# Patient Record
Sex: Female | Born: 1945 | Race: White | Hispanic: No | Marital: Married | Smoking: Former smoker
Health system: Southern US, Community
[De-identification: ages and names within clinical notes are randomized; demographics above are authoritative.]

## PROBLEM LIST (undated history)

## (undated) DIAGNOSIS — I2510 Atherosclerotic heart disease of native coronary artery without angina pectoris: Secondary | ICD-10-CM

## (undated) DIAGNOSIS — M069 Rheumatoid arthritis, unspecified: Secondary | ICD-10-CM

## (undated) DIAGNOSIS — E119 Type 2 diabetes mellitus without complications: Secondary | ICD-10-CM

## (undated) DIAGNOSIS — H269 Unspecified cataract: Secondary | ICD-10-CM

## (undated) DIAGNOSIS — I272 Pulmonary hypertension, unspecified: Secondary | ICD-10-CM

## (undated) DIAGNOSIS — I509 Heart failure, unspecified: Secondary | ICD-10-CM

## (undated) DIAGNOSIS — C801 Malignant (primary) neoplasm, unspecified: Secondary | ICD-10-CM

## (undated) DIAGNOSIS — M1990 Unspecified osteoarthritis, unspecified site: Secondary | ICD-10-CM

## (undated) DIAGNOSIS — E785 Hyperlipidemia, unspecified: Secondary | ICD-10-CM

## (undated) HISTORY — DX: Unspecified osteoarthritis, unspecified site: M19.90

## (undated) HISTORY — DX: Malignant (primary) neoplasm, unspecified: C80.1

## (undated) HISTORY — PX: APPENDECTOMY: SHX54

## (undated) HISTORY — PX: ABDOMINAL HYSTERECTOMY: SHX81

## (undated) HISTORY — DX: Unspecified cataract: H26.9

## (undated) HISTORY — PX: ADRENALECTOMY: SHX876

---

## 1998-04-24 ENCOUNTER — Encounter: Payer: Self-pay | Admitting: Emergency Medicine

## 1998-04-24 ENCOUNTER — Emergency Department (HOSPITAL_COMMUNITY): Admission: EM | Admit: 1998-04-24 | Discharge: 1998-04-24 | Payer: Self-pay | Admitting: Emergency Medicine

## 1999-01-11 ENCOUNTER — Encounter: Payer: Self-pay | Admitting: Family Medicine

## 1999-01-11 ENCOUNTER — Ambulatory Visit (HOSPITAL_COMMUNITY): Admission: RE | Admit: 1999-01-11 | Discharge: 1999-01-11 | Payer: Self-pay | Admitting: Family Medicine

## 1999-03-14 ENCOUNTER — Ambulatory Visit (HOSPITAL_COMMUNITY): Admission: RE | Admit: 1999-03-14 | Discharge: 1999-03-14 | Payer: Self-pay | Admitting: Family Medicine

## 1999-03-14 ENCOUNTER — Encounter: Payer: Self-pay | Admitting: Family Medicine

## 1999-09-13 ENCOUNTER — Encounter: Payer: Self-pay | Admitting: Family Medicine

## 1999-09-13 ENCOUNTER — Ambulatory Visit (HOSPITAL_COMMUNITY): Admission: RE | Admit: 1999-09-13 | Discharge: 1999-09-13 | Payer: Self-pay | Admitting: Family Medicine

## 2001-02-22 ENCOUNTER — Ambulatory Visit (HOSPITAL_BASED_OUTPATIENT_CLINIC_OR_DEPARTMENT_OTHER): Admission: RE | Admit: 2001-02-22 | Discharge: 2001-02-22 | Payer: Self-pay | Admitting: Orthopedic Surgery

## 2001-04-17 ENCOUNTER — Encounter: Payer: Self-pay | Admitting: Emergency Medicine

## 2001-04-17 ENCOUNTER — Emergency Department (HOSPITAL_COMMUNITY): Admission: EM | Admit: 2001-04-17 | Discharge: 2001-04-17 | Payer: Self-pay | Admitting: *Deleted

## 2002-01-24 ENCOUNTER — Encounter: Admission: RE | Admit: 2002-01-24 | Discharge: 2002-01-24 | Payer: Self-pay | Admitting: Family Medicine

## 2002-01-24 ENCOUNTER — Encounter: Payer: Self-pay | Admitting: Family Medicine

## 2003-02-25 ENCOUNTER — Ambulatory Visit (HOSPITAL_COMMUNITY): Admission: RE | Admit: 2003-02-25 | Discharge: 2003-02-25 | Payer: Self-pay | Admitting: Urology

## 2003-02-25 ENCOUNTER — Encounter: Payer: Self-pay | Admitting: Urology

## 2003-03-31 ENCOUNTER — Ambulatory Visit (HOSPITAL_COMMUNITY): Admission: RE | Admit: 2003-03-31 | Discharge: 2003-03-31 | Payer: Self-pay | Admitting: Endocrinology

## 2003-05-04 ENCOUNTER — Inpatient Hospital Stay (HOSPITAL_COMMUNITY): Admission: RE | Admit: 2003-05-04 | Discharge: 2003-05-07 | Payer: Self-pay | Admitting: Urology

## 2005-08-02 ENCOUNTER — Ambulatory Visit: Payer: Self-pay | Admitting: Internal Medicine

## 2005-12-11 ENCOUNTER — Encounter: Admission: RE | Admit: 2005-12-11 | Discharge: 2005-12-11 | Payer: Self-pay | Admitting: Orthopaedic Surgery

## 2008-03-03 ENCOUNTER — Ambulatory Visit: Payer: Self-pay

## 2011-11-17 ENCOUNTER — Ambulatory Visit: Payer: Self-pay | Admitting: Internal Medicine

## 2012-08-23 ENCOUNTER — Inpatient Hospital Stay: Payer: Self-pay | Admitting: Surgery

## 2012-08-23 LAB — COMPREHENSIVE METABOLIC PANEL
Albumin: 3.3 g/dL — ABNORMAL LOW (ref 3.4–5.0)
Alkaline Phosphatase: 86 U/L (ref 50–136)
Anion Gap: 8 (ref 7–16)
BUN: 8 mg/dL (ref 7–18)
Bilirubin,Total: 0.7 mg/dL (ref 0.2–1.0)
Calcium, Total: 8.8 mg/dL (ref 8.5–10.1)
Chloride: 102 mmol/L (ref 98–107)
Co2: 28 mmol/L (ref 21–32)
Creatinine: 0.58 mg/dL — ABNORMAL LOW (ref 0.60–1.30)
EGFR (African American): >60
EGFR (Non-African Amer.): >60
Glucose: 164 mg/dL — ABNORMAL HIGH (ref 65–99)
Osmolality: 278 (ref 275–301)
Potassium: 3.8 mmol/L (ref 3.5–5.1)
SGOT(AST): 23 U/L (ref 15–37)
SGPT (ALT): 34 U/L (ref 12–78)
Sodium: 138 mmol/L (ref 136–145)
Total Protein: 7.3 g/dL (ref 6.4–8.2)

## 2012-08-23 LAB — CBC
HCT: 48.4 % — ABNORMAL HIGH (ref 35.0–47.0)
HGB: 15.9 g/dL (ref 12.0–16.0)
MCH: 29 pg (ref 26.0–34.0)
MCHC: 32.8 g/dL (ref 32.0–36.0)
MCV: 88 fL (ref 80–100)
Platelet: 265 10*3/uL (ref 150–440)
RBC: 5.49 10*6/uL — ABNORMAL HIGH (ref 3.80–5.20)
RDW: 15.3 % — ABNORMAL HIGH (ref 11.5–14.5)
WBC: 17.8 10*3/uL — ABNORMAL HIGH (ref 3.6–11.0)

## 2012-08-23 LAB — URINALYSIS, COMPLETE
Bacteria: NONE SEEN
Bilirubin,UR: NEGATIVE
Glucose,UR: NEGATIVE mg/dL (ref 0–75)
Ketone: NEGATIVE
Leukocyte Esterase: NEGATIVE
Nitrite: NEGATIVE
Ph: 6 (ref 4.5–8.0)
Protein: NEGATIVE
RBC,UR: 2 /HPF (ref 0–5)
Specific Gravity: >1.06 (ref 1.003–1.030)
Squamous Epithelial: 1
WBC UR: 1 /HPF (ref 0–5)

## 2012-08-23 LAB — HCG, QUANTITATIVE, PREGNANCY: Beta Hcg, Quant.: 2 m[IU]/mL

## 2012-08-23 LAB — LIPASE, BLOOD: Lipase: 58 U/L — ABNORMAL LOW (ref 73–393)

## 2012-08-24 LAB — COMPREHENSIVE METABOLIC PANEL
Albumin: 2.1 g/dL — ABNORMAL LOW (ref 3.4–5.0)
Alkaline Phosphatase: 61 U/L (ref 50–136)
Anion Gap: 4 — ABNORMAL LOW (ref 7–16)
BUN: 8 mg/dL (ref 7–18)
Bilirubin,Total: 1.2 mg/dL — ABNORMAL HIGH (ref 0.2–1.0)
Calcium, Total: 7.6 mg/dL — ABNORMAL LOW (ref 8.5–10.1)
Chloride: 105 mmol/L (ref 98–107)
Co2: 29 mmol/L (ref 21–32)
Creatinine: 0.67 mg/dL (ref 0.60–1.30)
EGFR (African American): >60
EGFR (Non-African Amer.): >60
Glucose: 149 mg/dL — ABNORMAL HIGH (ref 65–99)
Osmolality: 277 (ref 275–301)
Potassium: 3.7 mmol/L (ref 3.5–5.1)
SGOT(AST): 12 U/L — ABNORMAL LOW (ref 15–37)
SGPT (ALT): 21 U/L (ref 12–78)
Sodium: 138 mmol/L (ref 136–145)
Total Protein: 5.3 g/dL — ABNORMAL LOW (ref 6.4–8.2)

## 2012-08-24 LAB — CBC WITH DIFFERENTIAL/PLATELET
Basophil #: 0 10*3/uL (ref 0.0–0.1)
Basophil %: 0.3 %
Eosinophil #: 0 10*3/uL (ref 0.0–0.7)
Eosinophil %: 0.1 %
HCT: 41.8 % (ref 35.0–47.0)
HGB: 13.7 g/dL (ref 12.0–16.0)
Lymphocyte #: 1.4 10*3/uL (ref 1.0–3.6)
Lymphocyte %: 15.6 %
MCH: 29.1 pg (ref 26.0–34.0)
MCHC: 32.8 g/dL (ref 32.0–36.0)
MCV: 89 fL (ref 80–100)
Monocyte #: 0.5 x10 3/mm (ref 0.2–0.9)
Monocyte %: 5.8 %
Neutrophil #: 7.2 10*3/uL — ABNORMAL HIGH (ref 1.4–6.5)
Neutrophil %: 78.2 %
Platelet: 235 10*3/uL (ref 150–440)
RBC: 4.72 10*6/uL (ref 3.80–5.20)
RDW: 15.2 % — ABNORMAL HIGH (ref 11.5–14.5)
WBC: 9.2 10*3/uL (ref 3.6–11.0)

## 2012-08-26 LAB — CBC WITH DIFFERENTIAL/PLATELET
Basophil #: 0 10*3/uL (ref 0.0–0.1)
Basophil %: 0.1 %
Eosinophil #: 0.1 10*3/uL (ref 0.0–0.7)
Eosinophil %: 1.1 %
HCT: 33.9 % — ABNORMAL LOW (ref 35.0–47.0)
HGB: 11.2 g/dL — ABNORMAL LOW (ref 12.0–16.0)
Lymphocyte #: 1 10*3/uL (ref 1.0–3.6)
Lymphocyte %: 10.5 %
MCH: 29.6 pg (ref 26.0–34.0)
MCHC: 33.1 g/dL (ref 32.0–36.0)
MCV: 89 fL (ref 80–100)
Monocyte #: 0.6 x10 3/mm (ref 0.2–0.9)
Monocyte %: 6 %
Neutrophil #: 7.6 10*3/uL — ABNORMAL HIGH (ref 1.4–6.5)
Neutrophil %: 82.3 %
Platelet: 230 10*3/uL (ref 150–440)
RBC: 3.8 10*6/uL (ref 3.80–5.20)
RDW: 14.5 % (ref 11.5–14.5)
WBC: 9.2 10*3/uL (ref 3.6–11.0)

## 2012-08-27 LAB — BASIC METABOLIC PANEL
Anion Gap: 3 — ABNORMAL LOW (ref 7–16)
BUN: 5 mg/dL — ABNORMAL LOW (ref 7–18)
Calcium, Total: 8.3 mg/dL — ABNORMAL LOW (ref 8.5–10.1)
Chloride: 100 mmol/L (ref 98–107)
Co2: 36 mmol/L — ABNORMAL HIGH (ref 21–32)
Creatinine: 0.44 mg/dL — ABNORMAL LOW (ref 0.60–1.30)
EGFR (African American): >60
EGFR (Non-African Amer.): >60
Glucose: 126 mg/dL — ABNORMAL HIGH (ref 65–99)
Osmolality: 276 (ref 275–301)
Potassium: 3.1 mmol/L — ABNORMAL LOW (ref 3.5–5.1)
Sodium: 139 mmol/L (ref 136–145)

## 2012-08-27 LAB — PATHOLOGY REPORT

## 2012-08-28 LAB — BASIC METABOLIC PANEL
Anion Gap: 3 — ABNORMAL LOW (ref 7–16)
BUN: 4 mg/dL — ABNORMAL LOW (ref 7–18)
Calcium, Total: 8.6 mg/dL (ref 8.5–10.1)
Chloride: 100 mmol/L (ref 98–107)
Co2: 36 mmol/L — ABNORMAL HIGH (ref 21–32)
Creatinine: 0.6 mg/dL (ref 0.60–1.30)
EGFR (African American): >60
EGFR (Non-African Amer.): >60
Glucose: 110 mg/dL — ABNORMAL HIGH (ref 65–99)
Osmolality: 275 (ref 275–301)
Potassium: 3.3 mmol/L — ABNORMAL LOW (ref 3.5–5.1)
Sodium: 139 mmol/L (ref 136–145)

## 2012-08-28 LAB — CBC WITH DIFFERENTIAL/PLATELET
Basophil #: 0.1 10*3/uL (ref 0.0–0.1)
Basophil %: 0.7 %
Eosinophil #: 0.2 10*3/uL (ref 0.0–0.7)
Eosinophil %: 2.2 %
HCT: 37 % (ref 35.0–47.0)
HGB: 12.5 g/dL (ref 12.0–16.0)
Lymphocyte #: 1.4 10*3/uL (ref 1.0–3.6)
Lymphocyte %: 19.3 %
MCH: 29.9 pg (ref 26.0–34.0)
MCHC: 33.8 g/dL (ref 32.0–36.0)
MCV: 88 fL (ref 80–100)
Monocyte #: 1 x10 3/mm — ABNORMAL HIGH (ref 0.2–0.9)
Monocyte %: 14.3 %
Neutrophil #: 4.6 10*3/uL (ref 1.4–6.5)
Neutrophil %: 63.5 %
Platelet: 311 10*3/uL (ref 150–440)
RBC: 4.2 10*6/uL (ref 3.80–5.20)
RDW: 14.6 % — ABNORMAL HIGH (ref 11.5–14.5)
WBC: 7.2 10*3/uL (ref 3.6–11.0)

## 2012-08-29 LAB — CULTURE, BLOOD (SINGLE)

## 2012-09-09 ENCOUNTER — Ambulatory Visit: Payer: Self-pay | Admitting: Surgery

## 2012-09-09 LAB — COMPREHENSIVE METABOLIC PANEL
Albumin: 2.7 g/dL — ABNORMAL LOW (ref 3.4–5.0)
Alkaline Phosphatase: 104 U/L (ref 50–136)
Anion Gap: 3 — ABNORMAL LOW (ref 7–16)
BUN: 7 mg/dL (ref 7–18)
Bilirubin,Total: 0.2 mg/dL (ref 0.2–1.0)
Calcium, Total: 8.9 mg/dL (ref 8.5–10.1)
Chloride: 102 mmol/L (ref 98–107)
Co2: 36 mmol/L — ABNORMAL HIGH (ref 21–32)
Creatinine: 0.6 mg/dL (ref 0.60–1.30)
EGFR (African American): >60
EGFR (Non-African Amer.): >60
Glucose: 121 mg/dL — ABNORMAL HIGH (ref 65–99)
Osmolality: 280 (ref 275–301)
Potassium: 3.7 mmol/L (ref 3.5–5.1)
SGOT(AST): 15 U/L (ref 15–37)
SGPT (ALT): 25 U/L (ref 12–78)
Sodium: 141 mmol/L (ref 136–145)
Total Protein: 7.1 g/dL (ref 6.4–8.2)

## 2012-09-09 LAB — CBC WITH DIFFERENTIAL/PLATELET
Basophil #: 0 10*3/uL (ref 0.0–0.1)
Basophil %: 0.5 %
Eosinophil #: 0.1 10*3/uL (ref 0.0–0.7)
Eosinophil %: 0.8 %
HCT: 41.5 % (ref 35.0–47.0)
HGB: 13.6 g/dL (ref 12.0–16.0)
Lymphocyte #: 2 10*3/uL (ref 1.0–3.6)
Lymphocyte %: 21 %
MCH: 28.5 pg (ref 26.0–34.0)
MCHC: 32.8 g/dL (ref 32.0–36.0)
MCV: 87 fL (ref 80–100)
Monocyte #: 0.7 x10 3/mm (ref 0.2–0.9)
Monocyte %: 7 %
Neutrophil #: 6.7 10*3/uL — ABNORMAL HIGH (ref 1.4–6.5)
Neutrophil %: 70.7 %
Platelet: 478 10*3/uL — ABNORMAL HIGH (ref 150–440)
RBC: 4.76 10*6/uL (ref 3.80–5.20)
RDW: 14.7 % — ABNORMAL HIGH (ref 11.5–14.5)
WBC: 9.4 10*3/uL (ref 3.6–11.0)

## 2012-09-17 ENCOUNTER — Ambulatory Visit: Payer: Self-pay | Admitting: Surgery

## 2012-10-15 DIAGNOSIS — M069 Rheumatoid arthritis, unspecified: Secondary | ICD-10-CM | POA: Insufficient documentation

## 2012-10-15 DIAGNOSIS — C55 Malignant neoplasm of uterus, part unspecified: Secondary | ICD-10-CM | POA: Insufficient documentation

## 2013-03-26 ENCOUNTER — Ambulatory Visit: Payer: Self-pay | Admitting: General Practice

## 2013-03-26 LAB — BASIC METABOLIC PANEL
Anion Gap: 0 — ABNORMAL LOW (ref 7–16)
BUN: 16 mg/dL (ref 7–18)
Calcium, Total: 9.5 mg/dL (ref 8.5–10.1)
Chloride: 103 mmol/L (ref 98–107)
Co2: 34 mmol/L — ABNORMAL HIGH (ref 21–32)
Creatinine: 0.57 mg/dL — ABNORMAL LOW (ref 0.60–1.30)
EGFR (African American): >60
EGFR (Non-African Amer.): >60
Glucose: 113 mg/dL — ABNORMAL HIGH (ref 65–99)
Osmolality: 276 (ref 275–301)
Potassium: 3.7 mmol/L (ref 3.5–5.1)
Sodium: 137 mmol/L (ref 136–145)

## 2013-03-26 LAB — CBC
HCT: 45.1 % (ref 35.0–47.0)
HGB: 15 g/dL (ref 12.0–16.0)
MCH: 29.2 pg (ref 26.0–34.0)
MCHC: 33.3 g/dL (ref 32.0–36.0)
MCV: 88 fL (ref 80–100)
Platelet: 283 10*3/uL (ref 150–440)
RBC: 5.15 10*6/uL (ref 3.80–5.20)
RDW: 15.2 % — ABNORMAL HIGH (ref 11.5–14.5)
WBC: 6.4 10*3/uL (ref 3.6–11.0)

## 2013-03-26 LAB — URINALYSIS, COMPLETE
Bacteria: NONE SEEN
Bilirubin,UR: NEGATIVE
Blood: NEGATIVE
Glucose,UR: NEGATIVE mg/dL (ref 0–75)
Ketone: NEGATIVE
Leukocyte Esterase: NEGATIVE
Nitrite: NEGATIVE
Ph: 6 (ref 4.5–8.0)
Protein: NEGATIVE
RBC,UR: 1 /HPF (ref 0–5)
Specific Gravity: 1.018 (ref 1.003–1.030)
Squamous Epithelial: <1
WBC UR: 3 /HPF (ref 0–5)

## 2013-03-26 LAB — MRSA PCR SCREENING

## 2013-03-26 LAB — PROTIME-INR
INR: 0.9
Prothrombin Time: 12.8 secs (ref 11.5–14.7)

## 2013-03-26 LAB — SEDIMENTATION RATE: Erythrocyte Sed Rate: 1 mm/hr (ref 0–30)

## 2013-03-26 LAB — APTT: Activated PTT: 26.9 secs (ref 23.6–35.9)

## 2013-03-27 LAB — URINE CULTURE

## 2013-04-09 ENCOUNTER — Inpatient Hospital Stay: Payer: Self-pay | Admitting: General Practice

## 2013-04-10 LAB — BASIC METABOLIC PANEL
Anion Gap: 5 — ABNORMAL LOW (ref 7–16)
BUN: 7 mg/dL (ref 7–18)
Calcium, Total: 8.4 mg/dL — ABNORMAL LOW (ref 8.5–10.1)
Chloride: 101 mmol/L (ref 98–107)
Co2: 31 mmol/L (ref 21–32)
Creatinine: 0.52 mg/dL — ABNORMAL LOW (ref 0.60–1.30)
EGFR (African American): >60
EGFR (Non-African Amer.): >60
Glucose: 156 mg/dL — ABNORMAL HIGH (ref 65–99)
Osmolality: 275 (ref 275–301)
Potassium: 3.8 mmol/L (ref 3.5–5.1)
Sodium: 137 mmol/L (ref 136–145)

## 2013-04-10 LAB — HEMOGLOBIN: HGB: 12.4 g/dL (ref 12.0–16.0)

## 2013-04-10 LAB — PLATELET COUNT: Platelet: 224 10*3/uL (ref 150–440)

## 2013-04-11 LAB — BASIC METABOLIC PANEL
Anion Gap: 6 — ABNORMAL LOW (ref 7–16)
BUN: 5 mg/dL — ABNORMAL LOW (ref 7–18)
Calcium, Total: 8 mg/dL — ABNORMAL LOW (ref 8.5–10.1)
Chloride: 101 mmol/L (ref 98–107)
Co2: 31 mmol/L (ref 21–32)
Creatinine: 0.56 mg/dL — ABNORMAL LOW (ref 0.60–1.30)
EGFR (African American): >60
EGFR (Non-African Amer.): >60
Glucose: 151 mg/dL — ABNORMAL HIGH (ref 65–99)
Osmolality: 276 (ref 275–301)
Potassium: 3.5 mmol/L (ref 3.5–5.1)
Sodium: 138 mmol/L (ref 136–145)

## 2013-04-11 LAB — PLATELET COUNT: Platelet: 238 10*3/uL (ref 150–440)

## 2013-04-11 LAB — HEMOGLOBIN: HGB: 12.9 g/dL (ref 12.0–16.0)

## 2013-09-25 DIAGNOSIS — Z96659 Presence of unspecified artificial knee joint: Secondary | ICD-10-CM | POA: Insufficient documentation

## 2013-10-01 IMAGING — CT CT ABD-PELV W/O CM
1 of 2 series · 15 of 32 positions shown, 19 images · non-contrast
Comparison: none

REASON FOR EXAM: Pelvic Abscess ORAL ONLY
COMMENTS:

PROCEDURE:     KCT - KCT ABDOMEN/PELVIS WO  - September 17, 2012 [DATE]
RESULT:
TECHNIQUE: Helical 3 mm sections were obtained from the lung bases through
the pubic symphysis with administration of oral contrast.

[Series 2: abd 3mm wo 3.0 i40f 3 · axial · 0.94mm/px · z∈[-392,+22]mm · 15 of 151 slices shown, 19 images]
[im 7/151  soft-tissue]
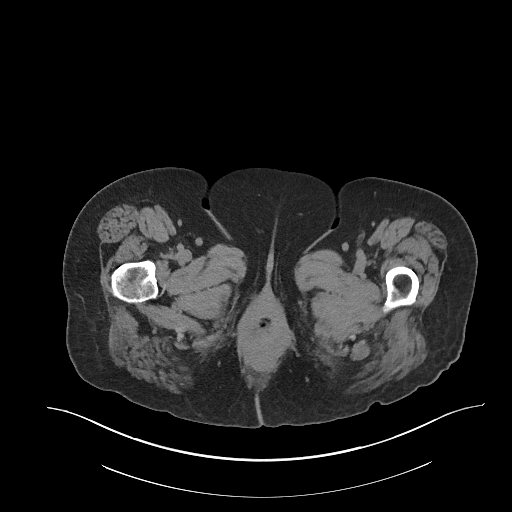
[im 7/151  bone]
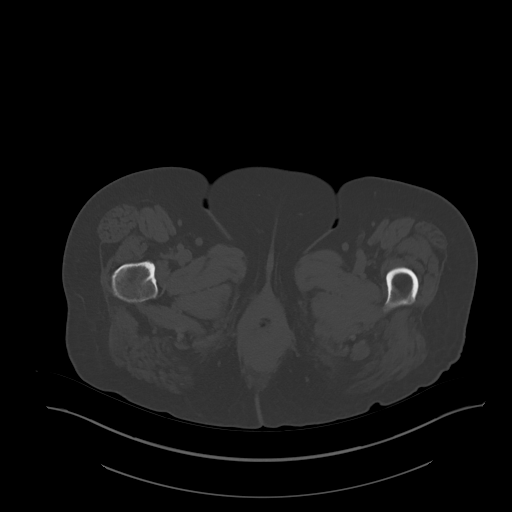
[im 19/151  soft-tissue]
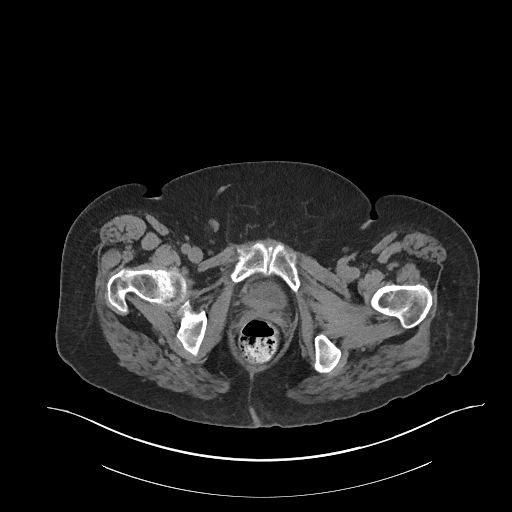
[im 31/151  soft-tissue]
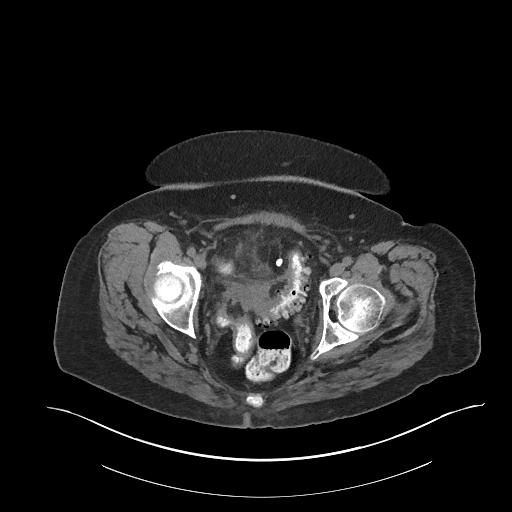
[im 43/151  soft-tissue]
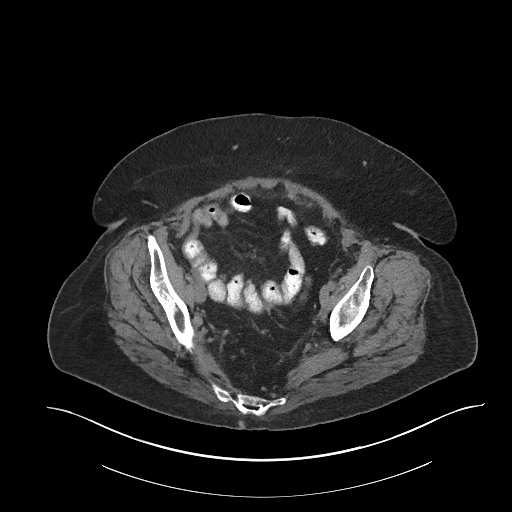
[im 55/151  soft-tissue]
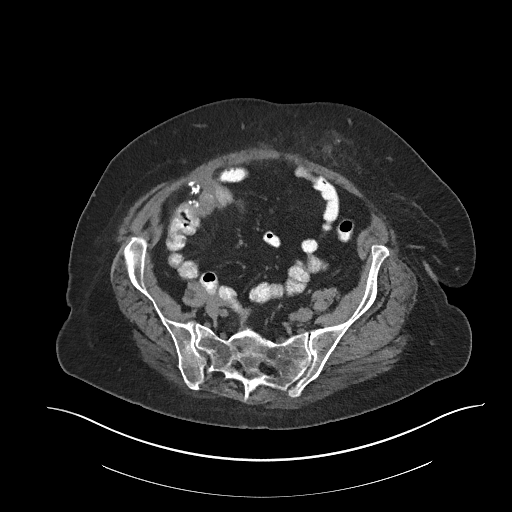
[im 67/151  soft-tissue]
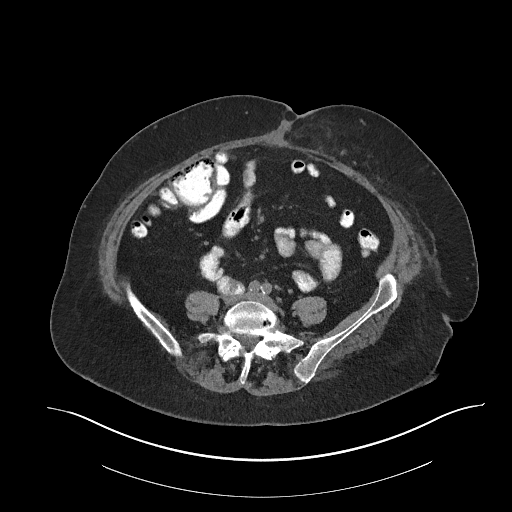
[im 79/151  soft-tissue]
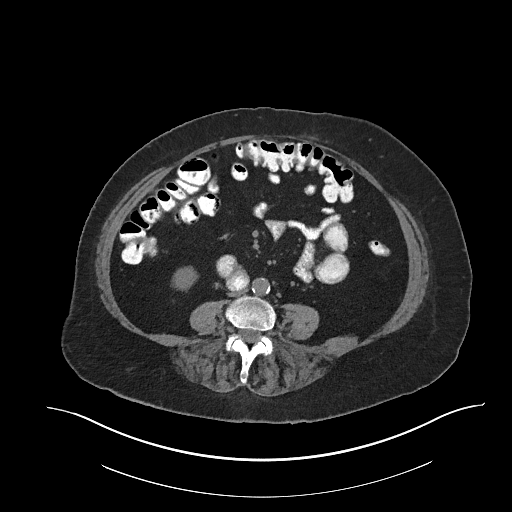
[im 85/151  soft-tissue]
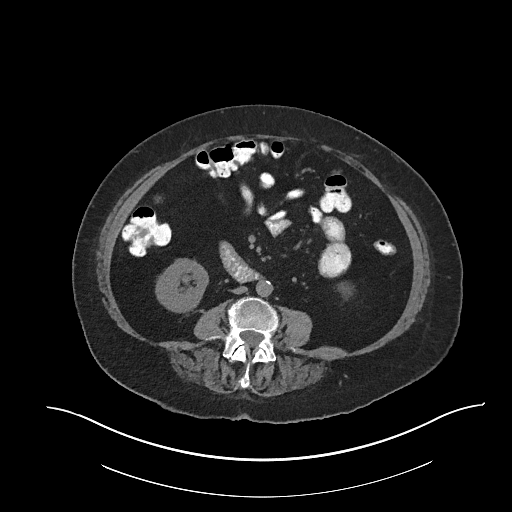
[im 97/151  soft-tissue]
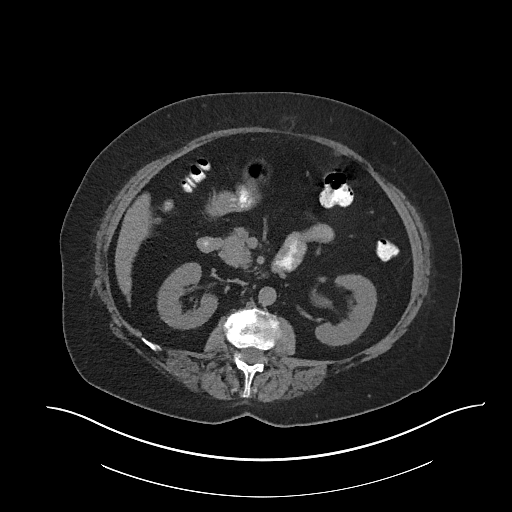
[im 97/151  bone]
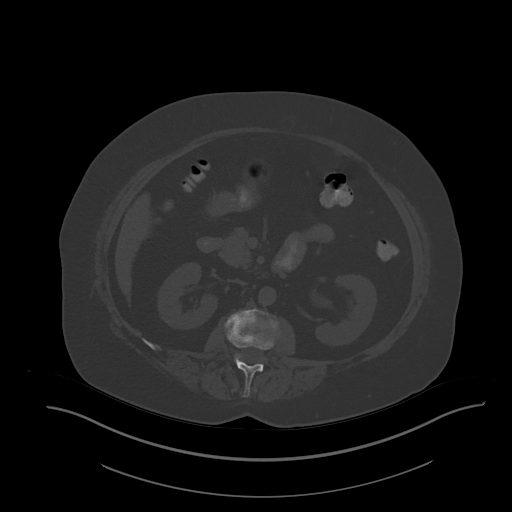
[im 109/151  soft-tissue]
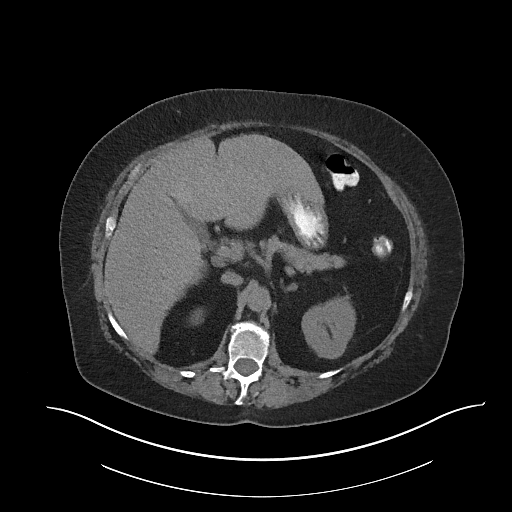
[im 121/151  soft-tissue]
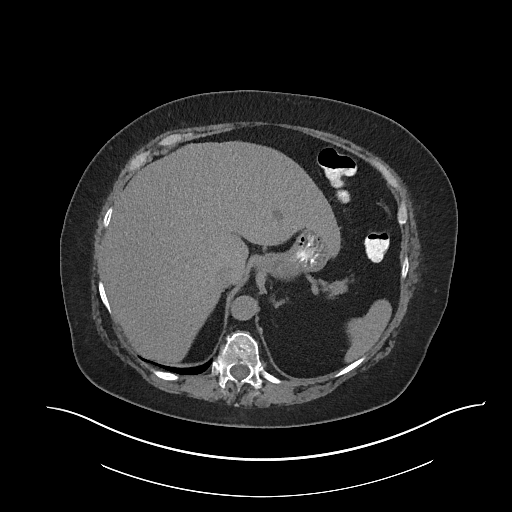
[im 127/151  lung]
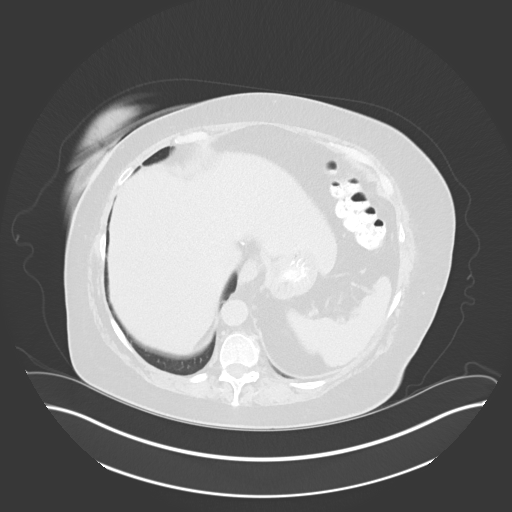
[im 133/151  soft-tissue]
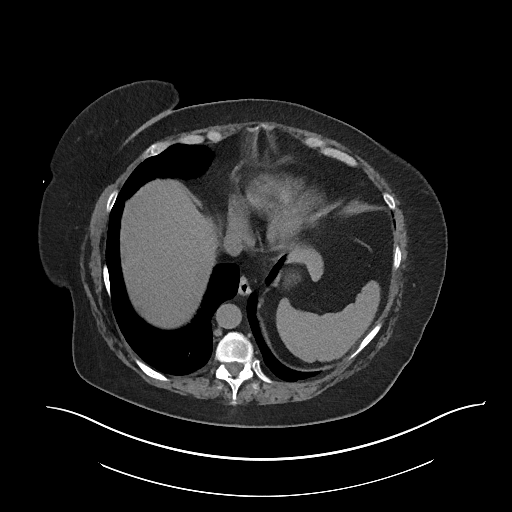
[im 133/151  lung]
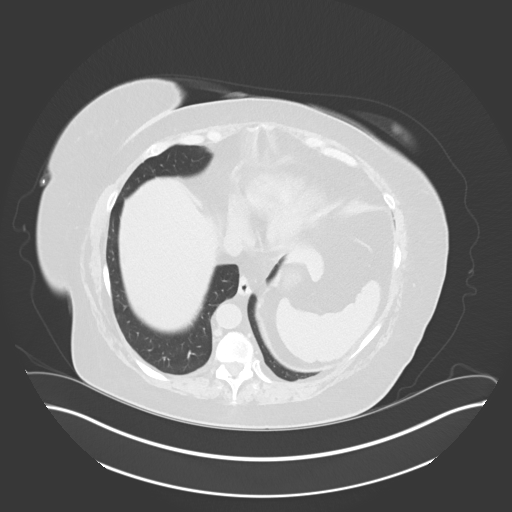
[im 139/151  lung]
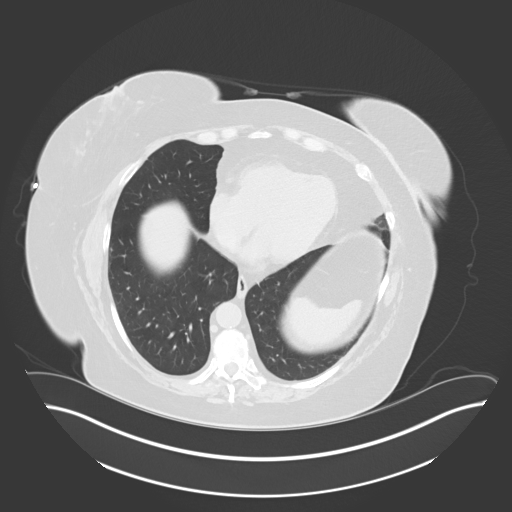
[im 145/151  soft-tissue]
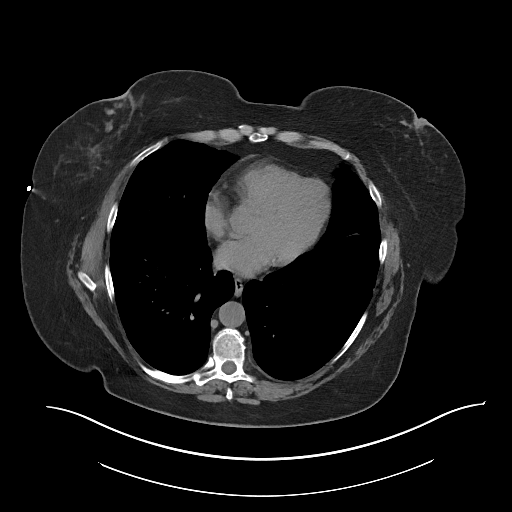
[im 145/151  lung]
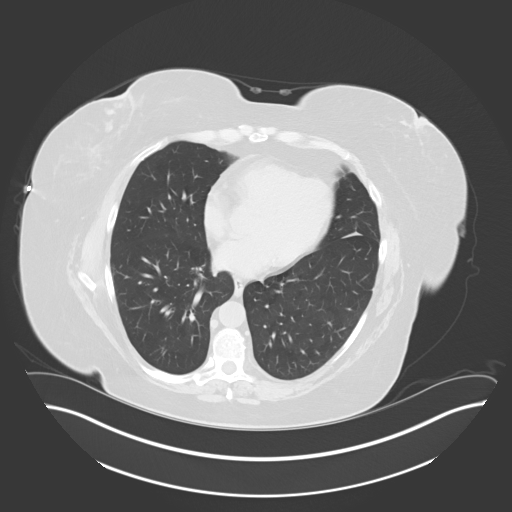

[15 of 32 positions shown; findings below may reference images not displayed]

FINDINGS: The lung bases are grossly unremarkable. The liver, spleen,
adrenals, pancreas, and kidneys is unremarkable.

There is no evidence of abdominal aortic aneurysm. There is no evidence of
bowel obstruction. Within the pelvis, a loop of sigmoid colon is identified
demonstrating mild bowel wall thickening and diffuse diverticulosis. There
is a small amount of fluid adjacent to the medial wall of the sigmoid colon.
Paraumbilical hernia is appreciated which has a moderate to large orifice
and there are mildly herniated loops of bowel within this region as well as
a focal area inferiorly containing fat. A fat containing ventral abdominal
wall hernia is once again appreciated and this is in a paraumbilical
location. No drainable loculated fluid collections are identified.
IMPRESSION: 1.  Small amount of free fluid adjacent to the sigmoid colon. Early or mild
diverticulitis changes; cannot be excluded, if clinically appropriate. There
are mild inflammatory changes within the fat adjacent to the umbilicus.
2.  A fat containing ventral abdominal wall hernia is once again appreciated
and this may be paraumbilical in location. No drainable loculated fluid
collections.
This is on the right.

## 2014-07-09 DIAGNOSIS — R0602 Shortness of breath: Secondary | ICD-10-CM | POA: Diagnosis not present

## 2014-07-09 DIAGNOSIS — E559 Vitamin D deficiency, unspecified: Secondary | ICD-10-CM | POA: Diagnosis not present

## 2014-07-09 DIAGNOSIS — Z6839 Body mass index (BMI) 39.0-39.9, adult: Secondary | ICD-10-CM | POA: Diagnosis not present

## 2014-07-09 DIAGNOSIS — R7301 Impaired fasting glucose: Secondary | ICD-10-CM | POA: Diagnosis not present

## 2014-07-09 DIAGNOSIS — E785 Hyperlipidemia, unspecified: Secondary | ICD-10-CM | POA: Diagnosis not present

## 2014-07-09 DIAGNOSIS — R0609 Other forms of dyspnea: Secondary | ICD-10-CM | POA: Diagnosis not present

## 2014-07-09 DIAGNOSIS — I10 Essential (primary) hypertension: Secondary | ICD-10-CM | POA: Diagnosis not present

## 2014-07-09 DIAGNOSIS — R609 Edema, unspecified: Secondary | ICD-10-CM | POA: Diagnosis not present

## 2014-07-13 DIAGNOSIS — R05 Cough: Secondary | ICD-10-CM | POA: Diagnosis not present

## 2014-07-23 DIAGNOSIS — J189 Pneumonia, unspecified organism: Secondary | ICD-10-CM | POA: Diagnosis not present

## 2014-08-04 DIAGNOSIS — E559 Vitamin D deficiency, unspecified: Secondary | ICD-10-CM | POA: Diagnosis not present

## 2014-08-04 DIAGNOSIS — I10 Essential (primary) hypertension: Secondary | ICD-10-CM | POA: Diagnosis not present

## 2014-08-06 DIAGNOSIS — R739 Hyperglycemia, unspecified: Secondary | ICD-10-CM | POA: Diagnosis not present

## 2014-08-06 DIAGNOSIS — M1990 Unspecified osteoarthritis, unspecified site: Secondary | ICD-10-CM | POA: Diagnosis not present

## 2014-08-06 DIAGNOSIS — J189 Pneumonia, unspecified organism: Secondary | ICD-10-CM | POA: Diagnosis not present

## 2014-08-06 DIAGNOSIS — J309 Allergic rhinitis, unspecified: Secondary | ICD-10-CM | POA: Diagnosis not present

## 2014-08-06 DIAGNOSIS — J40 Bronchitis, not specified as acute or chronic: Secondary | ICD-10-CM | POA: Diagnosis not present

## 2014-08-06 DIAGNOSIS — E559 Vitamin D deficiency, unspecified: Secondary | ICD-10-CM | POA: Diagnosis not present

## 2014-08-06 DIAGNOSIS — Z6839 Body mass index (BMI) 39.0-39.9, adult: Secondary | ICD-10-CM | POA: Diagnosis not present

## 2014-08-06 DIAGNOSIS — I10 Essential (primary) hypertension: Secondary | ICD-10-CM | POA: Diagnosis not present

## 2014-08-12 DIAGNOSIS — J189 Pneumonia, unspecified organism: Secondary | ICD-10-CM | POA: Diagnosis not present

## 2014-08-17 DIAGNOSIS — M0589 Other rheumatoid arthritis with rheumatoid factor of multiple sites: Secondary | ICD-10-CM | POA: Diagnosis not present

## 2014-09-04 NOTE — Discharge Summary (Signed)
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   HISTORY OF PRESENT ILLNESS: The patient is a 69 year old female, who has been followed at [facility] for progression of right knee pain. She had reported a several year history of progressive knee pain. At the time of surgery, the right knee pain was giving her more problems than the left. She had localized most of the pain along the medial aspect of the knee. She had not seen any significant improvement in her condition despite the use of anti-inflammatory medications, intra-articular cortisone injections as well as Synvisc injections. She was diagnosed with rheumatoid arthritis approximately 2 years ago and is currently on Orencia with reasonably good control of her rheumatoid disease. The patient has been having progressive difficulty with ambulation due to the knee pain. She had appreciated some decrease in her range of motion. She reported start-up stiffness. The pain had progressed to the point that it was significantly interfering with her activities of daily living. X-rays taken in [place] showed narrowing of the medial cartilage space with bone-on-bone articulation being noted as well as being in a slight varus alignment. She was noted to have osteophyte formation as well as subchondral sclerosis. After discussion of the risks and benefits of surgical intervention, the patient expressed her understanding of the risks and benefits and agreed for plans for surgical intervention.   PROCEDURE: Right total knee arthroplasty using computer-assisted navigation.   ANESTHESIA: Spinal.   SOFT TISSUE RELEASE: Anterior cruciate ligament,  posterior cruciate ligament, deep and superficial medial collateral ligament, as well as patellofemoral ligament.   IMPLANTS UTILIZED: DePuy PFC Sigma size 2.5 posterior stabilized femoral component (cemented), size 3 MBT tibial component (cemented), 32 mm 3-peg oval dome patella (cemented), and a 12.5 mm stabilized rotating platform polyethylene insert. Gentamicin bone cement was utilized due to the patient's history of rheumatoid arthritis.   HOSPITAL COURSE: The patient tolerated the procedure very well. She had no complications. She was then taken to the PACU where she was stabilized and then transferred to the orthopedic floor. The patient began receiving anticoagulation therapy of Lovenox 30 mg subcutaneous every 12 hours per anesthesia and pharmacy protocol. She was fitted with TED stockings bilaterally. These were allowed to be removed 1 hour per 8 hour shift. The right one was applied on day 2 following removal of the Hemovac and dressing change. The patient was also fitted with AVI compression foot pumps bilaterally set at 80 mmHg. Her calves have been nontender. There has been no evidence of any DVTs. Negative Homans sign. Heels were elevated off the bed using rolled towels.   The patient has denied any chest pains or any shortness of breath. Vital signs have been stable. She has been afebrile. Hemodynamically she was stable. No transfusions were given other than the Autovac transfusions given the first 6 hours postoperatively.   Physical therapy was initiated on day 1 for gait training and transfers. She has been somewhat slow with the recovery. She did not meet the criteria for going home. Occupational therapy was also initiated on day 1 for ADLs and assistive devices.   The patient's IV, Foley and Hemovac were discontinued on day 2 along with a dressing change. The wound was free of  any drainage or signs of infection. Polar Care was reapplied to the surgical leg, maintaining a temperature of  40 to 50 degrees Fahrenheit.   The patient was discharged to home in improved stable condition. She is to continue weight-bearing as tolerated. Continue using a rolling walker until cleared by physical therapy to go to a quad cane. She will receive home health PT. She is to continue with TED stockings. These are to be removed at night if she so desires. She is to wear these however during the day. Elevate heels off the bed. Continue Polar Care maintaining a temperature of 40 to 50 degrees Fahrenheit. Try to use this around-the-clock for the first 2 weeks. Encourage the patient to continue cough and deep breathing q.2 hours while awake. Encourage her to use her incentive spirometer q.1 hour while awake. She is placed on a regular diet. She is instructed in wound care. The dressing is to remain clean and dry. She is not to take a shower until her staples are removed. She has an appointment in [facility] orthopedics on 12/11 at 9:30. She is to call the clinic sooner if any temperatures of 101.5 or greater or excessive bleeding. The patient is to resume her regular medication that she was on prior to admission. She was given a prescription for Roxicodone 5 to 10 mg every 4 to 6 hours p.r.n. for pain, Tramadol 50 to 100 mg every 4 to 6 hours p.r.n. for pain and Lovenox 40 mg subcutaneously daily for 14 days, then discontinue and begin taking one 81 mg enteric-coated aspirin unless contraindicated.   PAST MEDICAL HISTORY:  1.  Rheumatoid arthritis.  2.  Hyperlipidemia.  3.  Cancer of the adrenal gland. 4.  Uterine prolapse. 5.  Uterine cancer.  ____________________________ Vance Peper, PA jrw:aw D: 04/28/2013 10:51:19 ET T: 04/28/2013 11:30:02 ET JOB#: [id number]  cc: Vance Peper, PA, <Dictator> JON WOLFE PA ELECTRONICALLY SIGNED 04/29/2013 7:45

## 2014-09-04 NOTE — Consult Note (Signed)
PATIENT NAME:  Tammy Ashley, Tammy Ashley MR#:  [id number] DATE OF BIRTH:  [birth year]  DATE OF CONSULTATION:  08/25/2012  REFERRING PHYSICIAN:  Dr. Rexene Edison  CONSULTING PHYSICIAN:  Demetrios Loll, MD  PRIMARY CARE PHYSICIAN:  Dr. Forde Dandy   REASON FOR CONSULTATION:  Tachycardia.    HISTORY OF PRESENT ILLNESS:  The patient is a 69 year old Caucasian female with a past medical history of rheumatoid arthritis. She had an appendectomy 2 days ago due to perforated appendicitis, but developed tachycardia after surgery. [facility] hospital physician was requested for consult for tachycardia. The patient is alert, awake, oriented, in no acute distress. The patient has fever and mild abdominal pain, but she denies any chills, headache or dizziness. No chest pain, palpitation, orthopnea, nocturnal dyspnea. Has mild cough, but has no shortness of breath or hemoptysis, no sputum. The patient is status post surgery, on antibiotics and IV fluid support. The patient had a bowel movement, is on a full liquid diet. According to the patient, the patient had an echocardiogram last year, which was normal. The patient denies any hypertension, diabetes, heart attack or stroke.   PAST MEDICAL HISTORY: Rheumatoid arthritis, uterine cancer, endometriosis.   PAST SURGICAL HISTORY: Left knee surgery. Adrenal gland removal on the right side.   SOCIAL HISTORY: No smoking, alcohol drinking or illicit drugs.   FAMILY HISTORY: Both parents were healthy. No hypertension, diabetes, heart attack or stroke.   REVIEW OF SYSTEMS: CONSTITUTIONAL: Patient has a fever, but no chills. No headache or dizziness or weakness.  EYES: No double vision or blurry vision.  EARS, NOSE, THROAT: No postnasal drip, slurred speech or dysphagia.  CARDIOVASCULAR: No chest pain, palpitation, orthopnea or nocturnal dyspnea. No leg edema.  PULMONARY: Has mild cough with no wheezing, shortness of breath or hemoptysis.  GASTROINTESTINAL: Mild abdominal pain, but no  nausea, vomiting or diarrhea. No melena or bloody stool.  GENITOURINARY: No dysuria, hematuria or incontinence.  SKIN: No rash or jaundice.  NEUROLOGY: No syncope, loss of consciousness or seizure   ALLERGIES: HYDROCODONE.   HOME MEDICATIONS: 1.  Vitamin D3 at 50,000 units 1 cap once a week.  2.  Tylenol 325 mg p.o. 2 tablets every 4 hours p.r.n.  3.  Orencia 250 mg 1 IV once a month.  4.  Multivitamin 1 tablet p.o. daily.  5.  Arava 10 mg p.o. daily.   PHYSICAL EXAMINATION: VITAL SIGNS: Temperature 100.2, blood pressure 114/68, pulse 127, respirations 18, oxygen saturation 95% on 2 liters oxygen by nasal cannula.  GENERAL: The patient is alert, awake, oriented, in no acute distress.  HEENT: Pupils round, equal and reactive to light and accommodation. Moist oral mucosa. Clear oropharynx.  NECK: Supple. No JVD or carotid bruits. No lymphadenopathy. No thyromegaly.  CARDIOVASCULAR: S1, S2. Regular rate, tachycardia. No murmurs or gallops.  PULMONARY: Bilateral air entry. Mild expiratory wheezing. No crackles or rales. No use of accessory muscles to breathe.  ABDOMEN: Soft, obese. Bowel sounds present. Mild tenderness. No obvious organomegaly.  EXTREMITIES: No edema, clubbing or cyanosis. No calf tenderness.  SKIN: No rash or jaundice.  NEUROLOGIC: A and O x 3. No focal deficit. Power 5/5. Sensation is intact.   LABORATORY DATA, DIAGNOSTIC AND RADIOLOGIC DATA: CBC normal yesterday. White count was 17.8 two days ago. Electrolytes are normal. Glucose 149. Urinalysis is negative. Blood culture is negative.   CHEST X-RAY: No acute chest disease.   IMPRESSIONS:  1.  Tachycardia, possibly due to appendicitis and surgery.  2.  Leukocytosis, improved.  3.  Questionable  asthma. The patient denies any history of asthma or chronic obstructive pulmonary disease.  4.  Rheumatoid arthritis.  5.  Obesity.   RECOMMENDATIONS:  1.  The patient's tachycardia is due to underlying disease. The patient  just had appendicitis, status post appendectomy. The patient needs enough pain control and IV fluid support. In addition, the patient has a fever of 100.2, which also can cause tachycardia. The patient needs to continue antibiotics and IV fluid support and pain control, in addition to morphine.  I will add Percocet.  2.  For mild wheezing, I will add Xopenex every 6 hours 3.  Continue current deep vein thrombosis prophylaxis. I discussed the patient's condition and plan of treatment with the patient and the nurse.   TIME SPENT: About 55 minutes   ____________________________ Demetrios Loll, MD qc:cc D: 08/25/2012 16:07:00 ET T: 08/25/2012 17:41:27 ET JOB#: [id number]  cc: Demetrios Loll, MD, <Dictator>  Demetrios Loll MD ELECTRONICALLY SIGNED 08/25/2012 21:54

## 2014-09-04 NOTE — Op Note (Signed)
PATIENT NAME:  Tammy Ashley, Tammy Ashley MR#:  [id number] DATE OF BIRTH:  [birth year]  DATE OF PROCEDURE:  08/23/2012  PREOPERATIVE DIAGNOSIS: Acute appendicitis.   POSTOPERATIVE DIAGNOSIS:  Acute appendicitis ruptured with multiple intramesenteric apices.   PROCEDURE PERFORMED:  1.  Laparoscopic appendectomy.  2.  Laparoscopic lysis of adhesions greater than 45 minutes.  3.  Laparoscopic drainage of multiple intramesenteric abscesses.   SURGEON: Rosea Dory A. Arlen Legendre, M.D.   ANESTHESIA: General.   SPECIMENS: Appendix.  ESTIMATED BLOOD LOSS: 25 mL   COMPLICATIONS: None.   INDICATION FOR SURGERY: The patient is a pleasant 69 year old female who presents with approximately two days of right lower quadrant pain. She had a CT scan which showed appendix concern for perforation. She was thus brought to the operating room for laparoscopic appendectomy.   DETAILS OF PROCEDURE: Informed consent was obtained. The patient  was brought to the operating room suite. She was laid supine on the operating room table. She was induced. Endotracheal tube was placed, general anesthesia was administered. Her abdomen was prepped and draped in standard surgical fashion. A timeout was then performed, correctly identifying the patient name, operative site and procedure to be performed. She had multiple periumbilical hernias; therefore, I made a transverse infraumbilical incision, which deepened down to the fascia. The fascia was incised. The peritoneum was entered. There were large amounts of adhesions. I then put 2, 0 Vicryl stay sutures and put in a Hassan trocar and insufflated the abdomen. There was a large amount of adhesions. I did place a left lower quadrant 5 mm trocar and a suprapubic trocar and proceeded to take down adhesions, which are mostly between her abdominal wall and bowel. I was able to isolate her appendix. It was thickened and ruptured. I was able to make a hole in the mesoappendix at the base of the  appendix and placed an endoscopic stapler across the base of the appendix, blue load, and then used staples to transect the mesoappendix. I then removed the appendix with an Endo Catch bag through the infraumbilical port site. I then proceeded to lyse adhesions between different loops of bowel and unearthed multiple intraperitoneal abscesses. I said then irrigated her abdomen with approximately 4 liters of normal saline. After I was assured that everything was hemostatic and that all purulence removed, I then deflated the abdomen. I closed her infraumbilical port site with a 3-0 Vicryl figure-of-eight suture and then closed her port site skin with inverted, deep dermal 4-0 Monocryl. I then placed Steri-Strips, Telfa gauze and Tegaderm over  her wounds. She was then awoken, extubated and brought to the postanesthesia care unit. There were no immediate complications. Needle, sponge and instrument counts were correct at the end of the procedure.    ____________________________ Glena Norfolk. Nickalus Thornsberry, MD cal:cc D: 09/03/2012 22:41:00 ET T: 09/03/2012 23:20:57 ET JOB#: [id number]  cc: Harrell Gave A. Kenzli Barritt, MD, <Dictator>  Floyde Parkins MD ELECTRONICALLY SIGNED 09/06/2012 1:29

## 2014-09-04 NOTE — Op Note (Signed)
PATIENT NAME:  Tammy Ashley, ALLENSWORTH MR#:  [id number] DATE OF BIRTH:  Jan 28, 1946  DATE OF PROCEDURE:  08/26/2012  PREOPERATIVE DIAGNOSIS: Acute appendicitis.   POSTOPERATIVE DIAGNOSIS:  Acute appendicitis ruptured with multiple intramesenteric apices.   PROCEDURE PERFORMED:  1.  Laparoscopic appendectomy.  2.  Laparoscopic lysis of adhesions greater than 45 minutes.  3.  Drainage of multiple intramesenteric abscesses.   SURGEON: Nataya Bastedo A. Ellia Knowlton, M.D.   ANESTHESIA: General.   SPECIMENS: Appendix.  ESTIMATED BLOOD LOSS: 25 mL   COMPLICATIONS: None.   INDICATION FOR SURGERY: The patient is a pleasant 69 year old female who presents with approximately two days of right lower quadrant pain. She had a CT scan which showed appendix concern for perforation. She was thus brought to the operating room for laparoscopic appendectomy.   DETAILS OF PROCEDURE: Informed consent was obtained. The patient  was brought to the operating room suite. She was laid supine on the operating room table. She was induced. Endotracheal tube was placed, general anesthesia was administered. Her abdomen was prepped and draped in standard surgical fashion. A timeout was then performed, correctly identifying the patient name, operative site and procedure to be performed. She had multiple periumbilical hernias; therefore, I made a transverse infraumbilical incision, which deepened down to the fascia. The fascia was incised. The peritoneum was entered. There were large amounts of adhesions. I then put 2, 0 Vicryl stay sutures and put in a Hassan trocar and insufflated the abdomen. There was a large amount of adhesions. I did place a left lower quadrant 5 mm trocar and a suprapubic trocar and proceeded to take down adhesions, which are mostly between her abdominal wall and bowel. I was able to isolate her appendix. It was thickened and ruptured. I was able to make a hole in the mesoappendix at the base of the appendix and  placed an endoscopic stapler across the base of the appendix, blue load, and then used staples to transect the mesoappendix. I then removed the appendix with an Endo Catch bag through the infraumbilical port site. I then proceeded to lyse adhesions between different loops of bowel and unearthed multiple intraperitoneal abscesses. I said then irrigated her abdomen with approximately 4 liters of normal saline. After I was assured that everything was hemostatic and that all purulence removed, I then deflated the abdomen. I closed her infraumbilical port site with a 3-0 Vicryl figure-of-eight suture and then closed her port site skin with inverted, deep dermal 4-0 Monocryl. I then placed Steri-Strips, Telfa gauze and Tegaderm over  her wounds. She was then awoken, extubated and brought to the postanesthesia care unit. There were no immediate complications. Needle, sponge and instrument counts were correct at the end of the procedure.    ____________________________ Glena Norfolk. Maame Dack, MD cal:cc D: 09/03/2012 22:41:48 ET T: 09/03/2012 23:20:57 ET JOB#: [id number]  cc: Harrell Gave A. Rosy Estabrook, MD, <Dictator>

## 2014-09-04 NOTE — Discharge Summary (Signed)
PATIENT NAME:  Tammy Ashley, Tammy Ashley MR#:  [id number] DATE OF BIRTH:  [birth year]  DATE OF ADMISSION:  08/23/2012 DATE OF DISCHARGE:  08/29/2012  DISCHARGE DIAGNOSES:  1.  Appendicitis, ruptured with multiple intra-abdominal abscesses.  2.  Postoperative ileus.  3.  History of rheumatoid arthritis.  4.  History of uterine cancer, status post vaginal hysterectomy.  5.  History of right adrenal pheochromocytoma status post resection.  6.  History of laparoscopy and retroperitoneal lymph node dissection.  7.  History of knee surgery.  8.  History of endometriosis.   DISCHARGE MEDICATIONS:  Are as follows:  Vitamin D3 5000 units by mouth q. weekly, multivitamin 1 tab by mouth daily, Percocet 1 tab by mouth q. 4 hours as needed pain, Bactrim 500 mg by mouth twice daily for a total of 9 more days following discharge.   HOSPITAL COURSE:  The patient underwent emergency laparoscopic cholecystectomy.  Postoperatively, she had a postoperative ileus and was given IV fluids and IV antibiotics.  As her bowels began to move her diet was advanced to clear liquids and later a regular diet.  Her pain medicine was transitioned from IV pain meds to by mouth Percocet and her fluids were hep-locked.  She was also transitioned to by mouth antibiotics for a total of 14 days.  At time of discharge was tolerating good by mouth with good by mouth pain control.  She was voiding and stooling spontaneously.   DISCHARGE INSTRUCTIONS:  Are as follows:  The patient is to follow up as an outpatient.  She is to call or return to ED if she has fever of greater than 100.5, nausea, vomiting, or any redness or drainage from incision.    ____________________________ Glena Norfolk Anthone Prieur, MD cal:ea D: 09/03/2012 22:45:10 ET T: 09/03/2012 23:15:34 ET JOB#: [id number]  cc: Harrell Gave A. Cahlil Sattar, MD, <Dictator> Floyde Parkins MD ELECTRONICALLY SIGNED 09/06/2012 1:28

## 2014-09-04 NOTE — Op Note (Signed)
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   PROCEDURE PERFORMED: Right total knee arthroplasty using computer-assisted navigation.   SURGEON: Laurice Record. Holley Bouche., MD   ASSISTANT: Vance Peper, PA (required to maintain retraction throughout the procedure).   ANESTHESIA: Spinal.   ESTIMATED BLOOD LOSS: 100 mL.   FLUIDS REPLACED: 1800 mL of crystalloid.   TOURNIQUET TIME: 75 minutes.   DRAINS: Two medium drains to reinfusion system.   SOFT TISSUE RELEASES: Anterior cruciate ligament, posterior cruciate ligament, deep and superficial medial collateral ligament, and patellofemoral ligament.   IMPLANTS UTILIZED: DePuy PFC Sigma size 2.5 posterior stabilized femoral component (cemented), size 3 MBT tibial component (cemented), 32 mm 3-peg oval dome patella (cemented), and a 12.5 mm stabilized rotating platform polyethylene insert. Gentamicin bone cement was utilized due to the patient's history of rheumatoid arthritis.   INDICATIONS FOR SURGERY: The patient is a 69 year old female who has been seen for complaints of progressive right knee pain. X-rays demonstrated significant degenerative changes in tricompartmental fashion with relative varus deformity. After discussion of the risks and benefits of surgical intervention, the patient expressed understanding of the risks and benefits and agreed with plans for surgical intervention.   PROCEDURE IN DETAIL: The patient was brought into the operating room and after adequate spinal anesthesia was achieved, a tourniquet was placed on the patient's upper right thigh. The patient's right knee and leg were cleaned and prepped with alcohol and DuraPrep and draped in the usual  sterile fashion. A "timeout" was performed as per usual protocol. The right lower extremity was exsanguinated using an Esmarch, and the tourniquet was inflated to 300 mmHg. An anterior longitudinal incision was made followed by a standard mid vastus approach. A large effusion was evacuated. The deep fibers of the medial collateral ligament were elevated in a subperiosteal fashion off of the medial flare of the tibia so as to maintain a continuous soft tissue sleeve. The patella was subluxed laterally and the patellofemoral ligament was incised. Inspection of the knee demonstrated severe degenerative changes in tricompartmental fashion with full-thickness loss of articular cartilage to the medial compartment. Prominent osteophytes were debrided using a rongeur. Anterior and posterior cruciate ligaments were excised. Two 4.0 mm Schanz pins were inserted into the femur and into the tibia for attachment of the array of trackers used for computer-assisted navigation. Hip center was identified using the circumduction technique. Distal landmarks were mapped using the computer. The distal femur and proximal tibia were mapped using the computer. Distal femoral cutting guide was positioned  using computer-assisted navigation so as to achieve a 5 degree distal valgus cut. Cut was performed and verified using the computer. Distal femur was sized and it was felt that a size 2.5 femoral component was appropriate. The size 2.5 cutting guide was positioned and the anterior cut was performed and verified using the computer. This was followed by completion of the posterior and chamfer cuts. Femoral cutting guide for the central box was then positioned and the central box cut was performed. Attention was then directed to the proximal tibia. Medial and lateral menisci were excised. The extramedullary tibial cutting guide was positioned using computer-assisted navigation so as to achieve a 0 degree varus valgus alignment and 0 degree  posterior slope. Cut was performed and verified  using the computer. The proximal tibia was sized and it was felt that a size 3 tray was appropriate. Tibial and femoral trials were inserted followed by insertion of a 10 mm polyethylene trial. The knee was felt to be tight medially. A Cobb elevator was used to elevate the superficial fibers of the medial collateral ligament. The 10 mm polyethylene trial was replaced by a 12.5 mm polyethylene trial. This allowed for excellent medial and lateral soft tissue balancing both in full extension and in flexion. Finally, the patella was cut and prepared so as to accommodate a 32 mm 3-peg oval dome patella. Patellar trial was placed and the knee was placed through a range of motion with excellent patellar tracking appreciated.   Femoral component was removed. Central post hole for the tibial component was reamed followed by insertion of a keel punch. Tibial trial was then removed. The cut surfaces of bone were irrigated with copious amounts of normal saline with antibiotic solution using pulsatile lavage and then suctioned dry. Polymethylmethacrylate cement with gentamicin was prepared in the usual fashion using a vacuum mixer. Cement was applied to the cut surface of the proximal tibia as well as along the undersurface of a size 3 MBT tibial component. The tibial component was positioned and impacted into place. Excess cement was removed using [employer] fast streamer. Cement was then applied to the cut surface of the femur as well as along the posterior flanges of a size 2.5 posterior stabilized femoral component. Femoral component was positioned and impacted into place. Excess cement was removed using [employer] fast streamer. A 12.5 mm polyethylene trial was inserted and the knee was brought into full extension with steady axial compression applied. Finally, cement was applied to the backside of a 32 mm 3-peg oval dome patella, and the patellar component was positioned and patellar clamp  applied. Excess cement was removed using [employer] fast streamer. After adequate curing of the cement, the tourniquet was deflated after a total tourniquet time of 75 minutes. Hemostasis was achieved using electrocautery. The knee was irrigated with copious amounts of normal saline with antibiotic solution using pulsatile lavage and then suctioned dry. The knee was inspected for any residual cement debris. 20 mL of 1.3% Exparel in 40 mL of normal saline was injected along the posterior capsule, medial and lateral gutters, and along the arthrotomy site. A 12.5 mm stabilized rotating platform polyethylene insert was inserted and the knee was placed through a range of motion. Excellent medial and lateral soft tissue balancing was appreciated both in full extension and in flexion. Excellent patellar tracking was appreciated. Two medium drains were placed in the wound bed and brought out through a separate stab incision. The medial parapatellar portion of the incision was reapproximated using interrupted sutures of #1 Vicryl. The subcutaneous tissue was injected with 30 mL of 0.25% Marcaine with epinephrine. The subcutaneous tissue was then reapproximated using first #0 Vicryl followed by 2-0 Vicryl. The skin was closed with skin staples. A sterile dressing was applied.   The patient tolerated the procedure well. She was transported to the recovery room in stable condition.   ____________________________ Laurice Record. Holley Bouche., MD jph:jcm D: 04/09/2013 19:48:35 ET T: 04/09/2013 20:42:48 ET JOB#: [id number]  cc: Laurice Record. Holley Bouche., MD, <Dictator> JAMES P Holley Bouche MD ELECTRONICALLY SIGNED 04/15/2013 9:45

## 2014-09-04 NOTE — H&P (Signed)
   Subjective/Chief Complaint RLQ pain x 2 days, nausea/vomiting.  Subjective fevers.   History of Present Illness Ms. Sharples is a pleasant 69 yo F with a history of uterine cancer and s/p right adrenalectomy who presents with 2 days of right lower quadrant pain, nausea and vomiting.  She says that her pain began suddenly two days ago.  It has not moved.  It has been followed by n/v.  Last PO this am but followed by vomiting.  Having regular BM. Temps of 101.  No chills, night sweats, shortness of breath, cough, dysuria/hematuria.   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 RA h/o knee surgery   Past Medical Health Cancer   Past Med/Surgical Hx:  Endometriosis:   Uterine Cancer: total hysterectomry  Rheumatoid Arthritis:   left knee surgery:   adrenal gland removed from right:   ALLERGIES:  Hydrocodone: Hives  Family and Social History:  Family History Cancer  Sister with breast ca   Social History negative tobacco, negative ETOH, negative Illicit drugs   Place of Living Home  [place] with husband   Review of Systems:  Subjective/Chief Complaint RLQ pain/N/V   Fever/Chills Yes   Cough No   Sputum No   Abdominal Pain Yes   Diarrhea No   Constipation No   Nausea/Vomiting Yes   SOB/DOE No   Chest Pain No   Dysuria No   Tolerating Diet No  Nauseated  Vomiting   Physical Exam:  GEN well developed, well nourished, no acute distress   HEENT pink conjunctivae, PERRL, moist oral mucosa   RESP normal resp effort  clear BS  no use of accessory muscles   CARD regular rate  no murmur  no thrills  no carotid bruits  No LE edema  no JVD   ABD positive tenderness  denies Flank Tenderness  no liver/spleen enlargement  no hernia  soft  normal BS  no Abdominal Bruits  no Adominal Mass   LYMPH negative neck, negative axillae   SKIN normal to palpation, No rashes, No ulcers    NEURO cranial nerves intact, negative Babinski R/L, negative Babinski R, negative Babinski L, negative rigidity, negative tremor, follows commands, strength:, motor/sensory function intact   PSYCH alert, A+O to time, place, person, good insight    Assessment/Admission Diagnosis Ms. Casa is a pleasant 69 yo F with RLQ pain, leukocytosis and appendicitis on CT scan.   Plan To OR for lap appendectomy   Electronic Signatures: Floyde Parkins (MD)  (Signed 11-Apr-14 16:08)  Authored: CHIEF COMPLAINT and HISTORY, PAST MEDICAL/SURGIAL HISTORY, ALLERGIES, FAMILY AND SOCIAL HISTORY, REVIEW OF SYSTEMS, PHYSICAL EXAM, ASSESSMENT AND PLAN   Last Updated: 11-Apr-14 16:08 by Floyde Parkins (MD)

## 2014-09-17 DIAGNOSIS — M0609 Rheumatoid arthritis without rheumatoid factor, multiple sites: Secondary | ICD-10-CM | POA: Diagnosis not present

## 2014-10-16 DIAGNOSIS — M0609 Rheumatoid arthritis without rheumatoid factor, multiple sites: Secondary | ICD-10-CM | POA: Diagnosis not present

## 2014-10-20 DIAGNOSIS — I10 Essential (primary) hypertension: Secondary | ICD-10-CM | POA: Diagnosis not present

## 2014-10-20 DIAGNOSIS — Z6838 Body mass index (BMI) 38.0-38.9, adult: Secondary | ICD-10-CM | POA: Diagnosis not present

## 2014-10-20 DIAGNOSIS — E119 Type 2 diabetes mellitus without complications: Secondary | ICD-10-CM | POA: Diagnosis not present

## 2014-10-20 DIAGNOSIS — C55 Malignant neoplasm of uterus, part unspecified: Secondary | ICD-10-CM | POA: Diagnosis not present

## 2014-10-20 DIAGNOSIS — Z8542 Personal history of malignant neoplasm of other parts of uterus: Secondary | ICD-10-CM | POA: Diagnosis not present

## 2014-10-20 DIAGNOSIS — M069 Rheumatoid arthritis, unspecified: Secondary | ICD-10-CM | POA: Diagnosis not present

## 2014-10-20 DIAGNOSIS — Z08 Encounter for follow-up examination after completed treatment for malignant neoplasm: Secondary | ICD-10-CM | POA: Diagnosis not present

## 2014-10-28 DIAGNOSIS — R7301 Impaired fasting glucose: Secondary | ICD-10-CM | POA: Diagnosis not present

## 2014-10-28 DIAGNOSIS — E559 Vitamin D deficiency, unspecified: Secondary | ICD-10-CM | POA: Diagnosis not present

## 2014-10-28 DIAGNOSIS — I10 Essential (primary) hypertension: Secondary | ICD-10-CM | POA: Diagnosis not present

## 2014-10-30 DIAGNOSIS — Z1212 Encounter for screening for malignant neoplasm of rectum: Secondary | ICD-10-CM | POA: Diagnosis not present

## 2014-10-30 DIAGNOSIS — M0609 Rheumatoid arthritis without rheumatoid factor, multiple sites: Secondary | ICD-10-CM | POA: Diagnosis not present

## 2014-11-13 DIAGNOSIS — M0609 Rheumatoid arthritis without rheumatoid factor, multiple sites: Secondary | ICD-10-CM | POA: Diagnosis not present

## 2014-11-13 DIAGNOSIS — M0589 Other rheumatoid arthritis with rheumatoid factor of multiple sites: Secondary | ICD-10-CM | POA: Diagnosis not present

## 2014-11-20 DIAGNOSIS — Z6837 Body mass index (BMI) 37.0-37.9, adult: Secondary | ICD-10-CM | POA: Diagnosis not present

## 2014-11-20 DIAGNOSIS — M069 Rheumatoid arthritis, unspecified: Secondary | ICD-10-CM | POA: Diagnosis not present

## 2014-11-20 DIAGNOSIS — Z0000 Encounter for general adult medical examination without abnormal findings: Secondary | ICD-10-CM | POA: Diagnosis not present

## 2014-11-20 DIAGNOSIS — R7301 Impaired fasting glucose: Secondary | ICD-10-CM | POA: Diagnosis not present

## 2014-11-20 DIAGNOSIS — E785 Hyperlipidemia, unspecified: Secondary | ICD-10-CM | POA: Diagnosis not present

## 2014-11-20 DIAGNOSIS — E119 Type 2 diabetes mellitus without complications: Secondary | ICD-10-CM | POA: Diagnosis not present

## 2014-11-20 DIAGNOSIS — Z1389 Encounter for screening for other disorder: Secondary | ICD-10-CM | POA: Diagnosis not present

## 2014-11-20 DIAGNOSIS — C541 Malignant neoplasm of endometrium: Secondary | ICD-10-CM | POA: Diagnosis not present

## 2014-11-20 DIAGNOSIS — E559 Vitamin D deficiency, unspecified: Secondary | ICD-10-CM | POA: Diagnosis not present

## 2014-12-15 DIAGNOSIS — M0609 Rheumatoid arthritis without rheumatoid factor, multiple sites: Secondary | ICD-10-CM | POA: Diagnosis not present

## 2015-01-13 DIAGNOSIS — E119 Type 2 diabetes mellitus without complications: Secondary | ICD-10-CM | POA: Diagnosis not present

## 2015-01-13 DIAGNOSIS — I10 Essential (primary) hypertension: Secondary | ICD-10-CM | POA: Diagnosis not present

## 2015-01-13 DIAGNOSIS — Z6837 Body mass index (BMI) 37.0-37.9, adult: Secondary | ICD-10-CM | POA: Diagnosis not present

## 2015-01-13 DIAGNOSIS — M069 Rheumatoid arthritis, unspecified: Secondary | ICD-10-CM | POA: Diagnosis not present

## 2015-01-14 DIAGNOSIS — M0609 Rheumatoid arthritis without rheumatoid factor, multiple sites: Secondary | ICD-10-CM | POA: Diagnosis not present

## 2015-01-14 DIAGNOSIS — R5383 Other fatigue: Secondary | ICD-10-CM | POA: Diagnosis not present

## 2015-02-12 DIAGNOSIS — M25569 Pain in unspecified knee: Secondary | ICD-10-CM | POA: Diagnosis not present

## 2015-02-12 DIAGNOSIS — M0609 Rheumatoid arthritis without rheumatoid factor, multiple sites: Secondary | ICD-10-CM | POA: Diagnosis not present

## 2015-02-12 DIAGNOSIS — M1990 Unspecified osteoarthritis, unspecified site: Secondary | ICD-10-CM | POA: Diagnosis not present

## 2015-02-12 DIAGNOSIS — Z79899 Other long term (current) drug therapy: Secondary | ICD-10-CM | POA: Diagnosis not present

## 2015-02-15 DIAGNOSIS — M0609 Rheumatoid arthritis without rheumatoid factor, multiple sites: Secondary | ICD-10-CM | POA: Diagnosis not present

## 2015-03-05 DIAGNOSIS — I10 Essential (primary) hypertension: Secondary | ICD-10-CM | POA: Diagnosis not present

## 2015-03-05 DIAGNOSIS — E785 Hyperlipidemia, unspecified: Secondary | ICD-10-CM | POA: Diagnosis not present

## 2015-03-05 DIAGNOSIS — R740 Nonspecific elevation of levels of transaminase and lactic acid dehydrogenase [LDH]: Secondary | ICD-10-CM | POA: Diagnosis not present

## 2015-03-05 DIAGNOSIS — E119 Type 2 diabetes mellitus without complications: Secondary | ICD-10-CM | POA: Diagnosis not present

## 2015-03-05 DIAGNOSIS — D3500 Benign neoplasm of unspecified adrenal gland: Secondary | ICD-10-CM | POA: Diagnosis not present

## 2015-03-05 DIAGNOSIS — Z6837 Body mass index (BMI) 37.0-37.9, adult: Secondary | ICD-10-CM | POA: Diagnosis not present

## 2015-03-05 DIAGNOSIS — M069 Rheumatoid arthritis, unspecified: Secondary | ICD-10-CM | POA: Diagnosis not present

## 2015-03-05 DIAGNOSIS — E559 Vitamin D deficiency, unspecified: Secondary | ICD-10-CM | POA: Diagnosis not present

## 2015-03-11 DIAGNOSIS — M25562 Pain in left knee: Secondary | ICD-10-CM | POA: Diagnosis not present

## 2015-03-11 DIAGNOSIS — M1712 Unilateral primary osteoarthritis, left knee: Secondary | ICD-10-CM | POA: Diagnosis not present

## 2015-03-11 DIAGNOSIS — R262 Difficulty in walking, not elsewhere classified: Secondary | ICD-10-CM | POA: Diagnosis not present

## 2015-03-12 DIAGNOSIS — M1712 Unilateral primary osteoarthritis, left knee: Secondary | ICD-10-CM | POA: Diagnosis not present

## 2015-03-12 DIAGNOSIS — R262 Difficulty in walking, not elsewhere classified: Secondary | ICD-10-CM | POA: Diagnosis not present

## 2015-03-12 DIAGNOSIS — M25562 Pain in left knee: Secondary | ICD-10-CM | POA: Diagnosis not present

## 2015-03-15 DIAGNOSIS — M0609 Rheumatoid arthritis without rheumatoid factor, multiple sites: Secondary | ICD-10-CM | POA: Diagnosis not present

## 2015-03-18 DIAGNOSIS — M1712 Unilateral primary osteoarthritis, left knee: Secondary | ICD-10-CM | POA: Diagnosis not present

## 2015-03-18 DIAGNOSIS — M25562 Pain in left knee: Secondary | ICD-10-CM | POA: Diagnosis not present

## 2015-03-25 DIAGNOSIS — M1712 Unilateral primary osteoarthritis, left knee: Secondary | ICD-10-CM | POA: Diagnosis not present

## 2015-03-25 DIAGNOSIS — M25562 Pain in left knee: Secondary | ICD-10-CM | POA: Diagnosis not present

## 2015-04-02 DIAGNOSIS — M25562 Pain in left knee: Secondary | ICD-10-CM | POA: Diagnosis not present

## 2015-04-02 DIAGNOSIS — M1712 Unilateral primary osteoarthritis, left knee: Secondary | ICD-10-CM | POA: Diagnosis not present

## 2015-04-12 DIAGNOSIS — M0609 Rheumatoid arthritis without rheumatoid factor, multiple sites: Secondary | ICD-10-CM | POA: Diagnosis not present

## 2015-04-13 DIAGNOSIS — M1712 Unilateral primary osteoarthritis, left knee: Secondary | ICD-10-CM | POA: Diagnosis not present

## 2015-04-13 DIAGNOSIS — M25562 Pain in left knee: Secondary | ICD-10-CM | POA: Diagnosis not present

## 2015-05-12 DIAGNOSIS — M0609 Rheumatoid arthritis without rheumatoid factor, multiple sites: Secondary | ICD-10-CM | POA: Diagnosis not present

## 2015-05-20 DIAGNOSIS — M1990 Unspecified osteoarthritis, unspecified site: Secondary | ICD-10-CM | POA: Diagnosis not present

## 2015-05-20 DIAGNOSIS — M0609 Rheumatoid arthritis without rheumatoid factor, multiple sites: Secondary | ICD-10-CM | POA: Diagnosis not present

## 2015-05-20 DIAGNOSIS — M25569 Pain in unspecified knee: Secondary | ICD-10-CM | POA: Diagnosis not present

## 2015-05-20 DIAGNOSIS — Z79899 Other long term (current) drug therapy: Secondary | ICD-10-CM | POA: Diagnosis not present

## 2015-06-09 DIAGNOSIS — M0609 Rheumatoid arthritis without rheumatoid factor, multiple sites: Secondary | ICD-10-CM | POA: Diagnosis not present

## 2015-06-16 DIAGNOSIS — L309 Dermatitis, unspecified: Secondary | ICD-10-CM | POA: Diagnosis not present

## 2015-07-12 DIAGNOSIS — M0609 Rheumatoid arthritis without rheumatoid factor, multiple sites: Secondary | ICD-10-CM | POA: Diagnosis not present

## 2015-08-18 DIAGNOSIS — M0609 Rheumatoid arthritis without rheumatoid factor, multiple sites: Secondary | ICD-10-CM | POA: Diagnosis not present

## 2015-09-01 DIAGNOSIS — R740 Nonspecific elevation of levels of transaminase and lactic acid dehydrogenase [LDH]: Secondary | ICD-10-CM | POA: Diagnosis not present

## 2015-09-01 DIAGNOSIS — D3500 Benign neoplasm of unspecified adrenal gland: Secondary | ICD-10-CM | POA: Diagnosis not present

## 2015-09-01 DIAGNOSIS — R5381 Other malaise: Secondary | ICD-10-CM | POA: Diagnosis not present

## 2015-09-01 DIAGNOSIS — E784 Other hyperlipidemia: Secondary | ICD-10-CM | POA: Diagnosis not present

## 2015-09-01 DIAGNOSIS — Z1389 Encounter for screening for other disorder: Secondary | ICD-10-CM | POA: Diagnosis not present

## 2015-09-01 DIAGNOSIS — C541 Malignant neoplasm of endometrium: Secondary | ICD-10-CM | POA: Diagnosis not present

## 2015-09-01 DIAGNOSIS — E559 Vitamin D deficiency, unspecified: Secondary | ICD-10-CM | POA: Diagnosis not present

## 2015-09-01 DIAGNOSIS — E538 Deficiency of other specified B group vitamins: Secondary | ICD-10-CM | POA: Diagnosis not present

## 2015-09-01 DIAGNOSIS — I10 Essential (primary) hypertension: Secondary | ICD-10-CM | POA: Diagnosis not present

## 2015-09-01 DIAGNOSIS — M069 Rheumatoid arthritis, unspecified: Secondary | ICD-10-CM | POA: Diagnosis not present

## 2015-09-01 DIAGNOSIS — E1142 Type 2 diabetes mellitus with diabetic polyneuropathy: Secondary | ICD-10-CM | POA: Diagnosis not present

## 2015-09-01 DIAGNOSIS — E1149 Type 2 diabetes mellitus with other diabetic neurological complication: Secondary | ICD-10-CM | POA: Diagnosis not present

## 2015-09-22 DIAGNOSIS — M0609 Rheumatoid arthritis without rheumatoid factor, multiple sites: Secondary | ICD-10-CM | POA: Diagnosis not present

## 2015-09-22 DIAGNOSIS — M25569 Pain in unspecified knee: Secondary | ICD-10-CM | POA: Diagnosis not present

## 2015-09-22 DIAGNOSIS — M1990 Unspecified osteoarthritis, unspecified site: Secondary | ICD-10-CM | POA: Diagnosis not present

## 2015-09-22 DIAGNOSIS — Z79899 Other long term (current) drug therapy: Secondary | ICD-10-CM | POA: Diagnosis not present

## 2015-09-23 DIAGNOSIS — M069 Rheumatoid arthritis, unspecified: Secondary | ICD-10-CM | POA: Diagnosis not present

## 2015-09-23 DIAGNOSIS — M0609 Rheumatoid arthritis without rheumatoid factor, multiple sites: Secondary | ICD-10-CM | POA: Diagnosis not present

## 2015-10-12 DIAGNOSIS — C541 Malignant neoplasm of endometrium: Secondary | ICD-10-CM | POA: Diagnosis not present

## 2015-10-12 DIAGNOSIS — Z6836 Body mass index (BMI) 36.0-36.9, adult: Secondary | ICD-10-CM | POA: Diagnosis not present

## 2015-10-21 DIAGNOSIS — M0609 Rheumatoid arthritis without rheumatoid factor, multiple sites: Secondary | ICD-10-CM | POA: Diagnosis not present

## 2015-11-18 DIAGNOSIS — M0609 Rheumatoid arthritis without rheumatoid factor, multiple sites: Secondary | ICD-10-CM | POA: Diagnosis not present

## 2015-12-16 DIAGNOSIS — M0609 Rheumatoid arthritis without rheumatoid factor, multiple sites: Secondary | ICD-10-CM | POA: Diagnosis not present

## 2015-12-16 DIAGNOSIS — R5383 Other fatigue: Secondary | ICD-10-CM | POA: Diagnosis not present

## 2015-12-23 DIAGNOSIS — M0609 Rheumatoid arthritis without rheumatoid factor, multiple sites: Secondary | ICD-10-CM | POA: Diagnosis not present

## 2015-12-23 DIAGNOSIS — M1990 Unspecified osteoarthritis, unspecified site: Secondary | ICD-10-CM | POA: Diagnosis not present

## 2015-12-23 DIAGNOSIS — M25569 Pain in unspecified knee: Secondary | ICD-10-CM | POA: Diagnosis not present

## 2015-12-23 DIAGNOSIS — Z79899 Other long term (current) drug therapy: Secondary | ICD-10-CM | POA: Diagnosis not present

## 2016-01-13 DIAGNOSIS — M0609 Rheumatoid arthritis without rheumatoid factor, multiple sites: Secondary | ICD-10-CM | POA: Diagnosis not present

## 2016-02-01 DIAGNOSIS — Z87442 Personal history of urinary calculi: Secondary | ICD-10-CM | POA: Diagnosis not present

## 2016-02-01 DIAGNOSIS — E559 Vitamin D deficiency, unspecified: Secondary | ICD-10-CM | POA: Diagnosis not present

## 2016-02-01 DIAGNOSIS — E784 Other hyperlipidemia: Secondary | ICD-10-CM | POA: Diagnosis not present

## 2016-02-01 DIAGNOSIS — D3500 Benign neoplasm of unspecified adrenal gland: Secondary | ICD-10-CM | POA: Diagnosis not present

## 2016-02-01 DIAGNOSIS — M069 Rheumatoid arthritis, unspecified: Secondary | ICD-10-CM | POA: Diagnosis not present

## 2016-02-01 DIAGNOSIS — C541 Malignant neoplasm of endometrium: Secondary | ICD-10-CM | POA: Diagnosis not present

## 2016-02-01 DIAGNOSIS — I10 Essential (primary) hypertension: Secondary | ICD-10-CM | POA: Diagnosis not present

## 2016-02-01 DIAGNOSIS — E1149 Type 2 diabetes mellitus with other diabetic neurological complication: Secondary | ICD-10-CM | POA: Diagnosis not present

## 2016-02-11 DIAGNOSIS — M0609 Rheumatoid arthritis without rheumatoid factor, multiple sites: Secondary | ICD-10-CM | POA: Diagnosis not present

## 2016-03-13 DIAGNOSIS — M0609 Rheumatoid arthritis without rheumatoid factor, multiple sites: Secondary | ICD-10-CM | POA: Diagnosis not present

## 2016-03-23 DIAGNOSIS — Z79899 Other long term (current) drug therapy: Secondary | ICD-10-CM | POA: Diagnosis not present

## 2016-03-23 DIAGNOSIS — M25569 Pain in unspecified knee: Secondary | ICD-10-CM | POA: Diagnosis not present

## 2016-03-23 DIAGNOSIS — M0609 Rheumatoid arthritis without rheumatoid factor, multiple sites: Secondary | ICD-10-CM | POA: Diagnosis not present

## 2016-03-23 DIAGNOSIS — Z23 Encounter for immunization: Secondary | ICD-10-CM | POA: Diagnosis not present

## 2016-03-23 DIAGNOSIS — M1990 Unspecified osteoarthritis, unspecified site: Secondary | ICD-10-CM | POA: Diagnosis not present

## 2016-04-13 DIAGNOSIS — M0609 Rheumatoid arthritis without rheumatoid factor, multiple sites: Secondary | ICD-10-CM | POA: Diagnosis not present

## 2016-05-17 DIAGNOSIS — M0609 Rheumatoid arthritis without rheumatoid factor, multiple sites: Secondary | ICD-10-CM | POA: Diagnosis not present

## 2016-06-06 DIAGNOSIS — L309 Dermatitis, unspecified: Secondary | ICD-10-CM | POA: Diagnosis not present

## 2016-06-06 DIAGNOSIS — I10 Essential (primary) hypertension: Secondary | ICD-10-CM | POA: Diagnosis not present

## 2016-06-06 DIAGNOSIS — C541 Malignant neoplasm of endometrium: Secondary | ICD-10-CM | POA: Diagnosis not present

## 2016-06-06 DIAGNOSIS — E1142 Type 2 diabetes mellitus with diabetic polyneuropathy: Secondary | ICD-10-CM | POA: Diagnosis not present

## 2016-06-06 DIAGNOSIS — E559 Vitamin D deficiency, unspecified: Secondary | ICD-10-CM | POA: Diagnosis not present

## 2016-06-06 DIAGNOSIS — E784 Other hyperlipidemia: Secondary | ICD-10-CM | POA: Diagnosis not present

## 2016-06-06 DIAGNOSIS — E1149 Type 2 diabetes mellitus with other diabetic neurological complication: Secondary | ICD-10-CM | POA: Diagnosis not present

## 2016-06-06 DIAGNOSIS — R740 Nonspecific elevation of levels of transaminase and lactic acid dehydrogenase [LDH]: Secondary | ICD-10-CM | POA: Diagnosis not present

## 2016-06-06 DIAGNOSIS — Z1389 Encounter for screening for other disorder: Secondary | ICD-10-CM | POA: Diagnosis not present

## 2016-06-07 DIAGNOSIS — H04123 Dry eye syndrome of bilateral lacrimal glands: Secondary | ICD-10-CM | POA: Diagnosis not present

## 2016-06-07 DIAGNOSIS — H25813 Combined forms of age-related cataract, bilateral: Secondary | ICD-10-CM | POA: Diagnosis not present

## 2016-06-07 DIAGNOSIS — E119 Type 2 diabetes mellitus without complications: Secondary | ICD-10-CM | POA: Diagnosis not present

## 2016-06-07 DIAGNOSIS — H16223 Keratoconjunctivitis sicca, not specified as Sjogren's, bilateral: Secondary | ICD-10-CM | POA: Diagnosis not present

## 2016-06-07 DIAGNOSIS — H524 Presbyopia: Secondary | ICD-10-CM | POA: Diagnosis not present

## 2016-06-07 DIAGNOSIS — H40013 Open angle with borderline findings, low risk, bilateral: Secondary | ICD-10-CM | POA: Diagnosis not present

## 2016-06-14 DIAGNOSIS — M0609 Rheumatoid arthritis without rheumatoid factor, multiple sites: Secondary | ICD-10-CM | POA: Diagnosis not present

## 2016-06-22 DIAGNOSIS — R21 Rash and other nonspecific skin eruption: Secondary | ICD-10-CM | POA: Diagnosis not present

## 2016-06-22 DIAGNOSIS — Z79899 Other long term (current) drug therapy: Secondary | ICD-10-CM | POA: Diagnosis not present

## 2016-06-22 DIAGNOSIS — M0609 Rheumatoid arthritis without rheumatoid factor, multiple sites: Secondary | ICD-10-CM | POA: Diagnosis not present

## 2016-06-22 DIAGNOSIS — M1990 Unspecified osteoarthritis, unspecified site: Secondary | ICD-10-CM | POA: Diagnosis not present

## 2016-06-27 DIAGNOSIS — L409 Psoriasis, unspecified: Secondary | ICD-10-CM | POA: Diagnosis not present

## 2016-07-19 DIAGNOSIS — M0609 Rheumatoid arthritis without rheumatoid factor, multiple sites: Secondary | ICD-10-CM | POA: Diagnosis not present

## 2016-07-19 DIAGNOSIS — E559 Vitamin D deficiency, unspecified: Secondary | ICD-10-CM | POA: Diagnosis not present

## 2016-08-08 ENCOUNTER — Other Ambulatory Visit: Payer: Self-pay | Admitting: Counselor

## 2016-08-08 DIAGNOSIS — Z87442 Personal history of urinary calculi: Secondary | ICD-10-CM | POA: Diagnosis not present

## 2016-08-08 DIAGNOSIS — R300 Dysuria: Secondary | ICD-10-CM | POA: Diagnosis not present

## 2016-08-08 DIAGNOSIS — R1032 Left lower quadrant pain: Secondary | ICD-10-CM | POA: Diagnosis not present

## 2016-08-11 ENCOUNTER — Ambulatory Visit
Admission: RE | Admit: 2016-08-11 | Discharge: 2016-08-11 | Disposition: A | Discharge status: Home / Self Care | Payer: Medicare Other | Source: Ambulatory Visit | Attending: Counselor | Admitting: Counselor

## 2016-08-11 DIAGNOSIS — Z90710 Acquired absence of both cervix and uterus: Secondary | ICD-10-CM | POA: Insufficient documentation

## 2016-08-11 DIAGNOSIS — R1032 Left lower quadrant pain: Secondary | ICD-10-CM

## 2016-08-11 DIAGNOSIS — N200 Calculus of kidney: Secondary | ICD-10-CM | POA: Insufficient documentation

## 2016-08-11 DIAGNOSIS — K5730 Diverticulosis of large intestine without perforation or abscess without bleeding: Secondary | ICD-10-CM | POA: Insufficient documentation

## 2016-08-11 DIAGNOSIS — K429 Umbilical hernia without obstruction or gangrene: Secondary | ICD-10-CM | POA: Insufficient documentation

## 2016-08-11 DIAGNOSIS — Z9089 Acquired absence of other organs: Secondary | ICD-10-CM | POA: Insufficient documentation

## 2016-08-14 ENCOUNTER — Ambulatory Visit: Payer: Self-pay

## 2016-08-15 DIAGNOSIS — R1032 Left lower quadrant pain: Secondary | ICD-10-CM | POA: Diagnosis not present

## 2016-08-15 DIAGNOSIS — Z6836 Body mass index (BMI) 36.0-36.9, adult: Secondary | ICD-10-CM | POA: Diagnosis not present

## 2016-08-15 DIAGNOSIS — B029 Zoster without complications: Secondary | ICD-10-CM | POA: Diagnosis not present

## 2016-09-08 DIAGNOSIS — Z79899 Other long term (current) drug therapy: Secondary | ICD-10-CM | POA: Diagnosis not present

## 2016-09-08 DIAGNOSIS — R21 Rash and other nonspecific skin eruption: Secondary | ICD-10-CM | POA: Diagnosis not present

## 2016-09-08 DIAGNOSIS — M1990 Unspecified osteoarthritis, unspecified site: Secondary | ICD-10-CM | POA: Diagnosis not present

## 2016-09-08 DIAGNOSIS — M0609 Rheumatoid arthritis without rheumatoid factor, multiple sites: Secondary | ICD-10-CM | POA: Diagnosis not present

## 2016-10-02 DIAGNOSIS — H16143 Punctate keratitis, bilateral: Secondary | ICD-10-CM | POA: Diagnosis not present

## 2016-10-02 DIAGNOSIS — I10 Essential (primary) hypertension: Secondary | ICD-10-CM | POA: Diagnosis not present

## 2016-10-02 DIAGNOSIS — H2511 Age-related nuclear cataract, right eye: Secondary | ICD-10-CM | POA: Diagnosis not present

## 2016-10-02 DIAGNOSIS — H16223 Keratoconjunctivitis sicca, not specified as Sjogren's, bilateral: Secondary | ICD-10-CM | POA: Diagnosis not present

## 2016-10-02 DIAGNOSIS — H2513 Age-related nuclear cataract, bilateral: Secondary | ICD-10-CM | POA: Diagnosis not present

## 2016-10-03 DIAGNOSIS — M0609 Rheumatoid arthritis without rheumatoid factor, multiple sites: Secondary | ICD-10-CM | POA: Diagnosis not present

## 2016-10-17 DIAGNOSIS — Z961 Presence of intraocular lens: Secondary | ICD-10-CM | POA: Diagnosis not present

## 2016-10-17 DIAGNOSIS — H2511 Age-related nuclear cataract, right eye: Secondary | ICD-10-CM | POA: Diagnosis not present

## 2016-10-19 ENCOUNTER — Encounter: Payer: Self-pay | Admitting: Pharmacist

## 2016-10-19 ENCOUNTER — Other Ambulatory Visit: Payer: Medicare Other | Admitting: Pharmacist

## 2016-10-19 DIAGNOSIS — L409 Psoriasis, unspecified: Secondary | ICD-10-CM

## 2016-10-19 NOTE — Patient Outreach (Addendum)
[facility]) Care Management  [address]   10/19/2016  Tammy Ashley Aug 26, 1945 [id number]  Subjective: Patient requested help with medication assistance during her husband's home visit. She is a patient of [facility] Provider, Reynold Bowen.  HIPAA identifiers were obtained. Verbal consent was obtained.    Objective:   Encounter Medications: Outpatient Encounter Prescriptions as of 10/19/2016  Medication Sig  . Abatacept (ORENCIA) 125 MG/ML SOSY Inject 125 mg into the skin every 30 (thirty) days.  Marland Kitchen atorvastatin (LIPITOR) 40 MG tablet Take 1 tablet by mouth daily.  Marland Kitchen Besifloxacin HCl (BESIVANCE) 0.6 % SUSP Apply 1 drop to eye 3 (three) times daily.  . Bromfenac Sodium (PROLENSA) 0.07 % SOLN Apply 1 drop to eye at bedtime and may repeat dose one time if needed.  . clobetasol ointment (TEMOVATE) 1.65 % Apply 1 application topically 2 (two) times daily.  . cycloSPORINE (RESTASIS) 0.05 % ophthalmic emulsion Place 1 drop into both eyes 2 (two) times daily.   . Difluprednate (DUREZOL) 0.05 % EMUL Apply 1 drop to eye 3 (three) times daily.  . diphenhydramine-acetaminophen (TYLENOL PM) 25-500 MG TABS tablet Take 1 tablet by mouth at bedtime.  . ergocalciferol (VITAMIN D2) 50000 units capsule Take 50,000 Units by mouth once a week.  . fluocinonide (LIDEX) 0.05 % external solution Apply 1 application topically 2 (two) times daily.  . furosemide (LASIX) 40 MG tablet Take 0.5 tablets by mouth daily.  Marland Kitchen leflunomide (ARAVA) 10 MG tablet Take 10 mg by mouth daily.  Marland Kitchen lisinopril (PRINIVIL,ZESTRIL) 10 MG tablet Take 1 tablet by mouth daily.  . metFORMIN (GLUCOPHAGE-XR) 500 MG 24 hr tablet Take 1 tablet by mouth 2 (two) times daily.  Marland Kitchen triamcinolone ointment (KENALOG) 0.1 % Apply 1 application topically 2 (two) times daily.   No facility-administered encounter medications on file as of 10/19/2016.     Functional Status: No flowsheet data found.  Fall/Depression Screening: No  flowsheet data found. No flowsheet data found.    Assessment: Medication review completed by comparing [facility], the patient's home medications, and summaries in Care Everywhere.  Patient reported having elevated copays for Restasis (which she is not currently using due to cost) and fluocinonide solution.  [facility] has a Patient Assistance Program that will provide Restasis.  The application was partially completed for the patient and given to her to take to her Opthalmalogist's appointment next week.  [facility] was called. The pharmacist reported Fluocinonide has a copay of $44.  Since fluocinonide is generic, a Patient Assistance Program is not available .  Patient was provided a coupon from "GoodRx" for Fluocinonide solution--$33 from [place].  Plan:  Pharmacy episode will be closed as the patient's needs have been met.  She is not being followed by case management and is not in any case management programs.   Elayne Guerin, PharmD, [place] Clinical Pharmacist [phone]

## 2016-10-30 DIAGNOSIS — Z961 Presence of intraocular lens: Secondary | ICD-10-CM | POA: Diagnosis not present

## 2016-10-30 DIAGNOSIS — H2512 Age-related nuclear cataract, left eye: Secondary | ICD-10-CM | POA: Diagnosis not present

## 2016-11-22 DIAGNOSIS — M0609 Rheumatoid arthritis without rheumatoid factor, multiple sites: Secondary | ICD-10-CM | POA: Diagnosis not present

## 2016-12-21 DIAGNOSIS — M0609 Rheumatoid arthritis without rheumatoid factor, multiple sites: Secondary | ICD-10-CM | POA: Diagnosis not present

## 2016-12-29 DIAGNOSIS — E1149 Type 2 diabetes mellitus with other diabetic neurological complication: Secondary | ICD-10-CM | POA: Diagnosis not present

## 2016-12-29 DIAGNOSIS — D3500 Benign neoplasm of unspecified adrenal gland: Secondary | ICD-10-CM | POA: Diagnosis not present

## 2016-12-29 DIAGNOSIS — Z87442 Personal history of urinary calculi: Secondary | ICD-10-CM | POA: Diagnosis not present

## 2016-12-29 DIAGNOSIS — E1142 Type 2 diabetes mellitus with diabetic polyneuropathy: Secondary | ICD-10-CM | POA: Diagnosis not present

## 2016-12-29 DIAGNOSIS — R740 Nonspecific elevation of levels of transaminase and lactic acid dehydrogenase [LDH]: Secondary | ICD-10-CM | POA: Diagnosis not present

## 2016-12-29 DIAGNOSIS — E559 Vitamin D deficiency, unspecified: Secondary | ICD-10-CM | POA: Diagnosis not present

## 2016-12-29 DIAGNOSIS — R42 Dizziness and giddiness: Secondary | ICD-10-CM | POA: Diagnosis not present

## 2016-12-29 DIAGNOSIS — I10 Essential (primary) hypertension: Secondary | ICD-10-CM | POA: Diagnosis not present

## 2016-12-29 DIAGNOSIS — Z6835 Body mass index (BMI) 35.0-35.9, adult: Secondary | ICD-10-CM | POA: Diagnosis not present

## 2016-12-29 DIAGNOSIS — M069 Rheumatoid arthritis, unspecified: Secondary | ICD-10-CM | POA: Diagnosis not present

## 2016-12-29 DIAGNOSIS — E784 Other hyperlipidemia: Secondary | ICD-10-CM | POA: Diagnosis not present

## 2016-12-29 DIAGNOSIS — C541 Malignant neoplasm of endometrium: Secondary | ICD-10-CM | POA: Diagnosis not present

## 2017-01-18 DIAGNOSIS — M0609 Rheumatoid arthritis without rheumatoid factor, multiple sites: Secondary | ICD-10-CM | POA: Diagnosis not present

## 2017-01-18 DIAGNOSIS — R5383 Other fatigue: Secondary | ICD-10-CM | POA: Diagnosis not present

## 2017-02-12 DIAGNOSIS — M0609 Rheumatoid arthritis without rheumatoid factor, multiple sites: Secondary | ICD-10-CM | POA: Diagnosis not present

## 2017-02-12 DIAGNOSIS — R21 Rash and other nonspecific skin eruption: Secondary | ICD-10-CM | POA: Diagnosis not present

## 2017-02-12 DIAGNOSIS — Z79899 Other long term (current) drug therapy: Secondary | ICD-10-CM | POA: Diagnosis not present

## 2017-02-12 DIAGNOSIS — E669 Obesity, unspecified: Secondary | ICD-10-CM | POA: Diagnosis not present

## 2017-02-12 DIAGNOSIS — M1990 Unspecified osteoarthritis, unspecified site: Secondary | ICD-10-CM | POA: Diagnosis not present

## 2017-02-15 DIAGNOSIS — M0609 Rheumatoid arthritis without rheumatoid factor, multiple sites: Secondary | ICD-10-CM | POA: Diagnosis not present

## 2017-03-19 DIAGNOSIS — M0609 Rheumatoid arthritis without rheumatoid factor, multiple sites: Secondary | ICD-10-CM | POA: Diagnosis not present

## 2017-03-20 DIAGNOSIS — Z96651 Presence of right artificial knee joint: Secondary | ICD-10-CM | POA: Diagnosis not present

## 2017-03-20 DIAGNOSIS — M25561 Pain in right knee: Secondary | ICD-10-CM | POA: Diagnosis not present

## 2017-04-16 DIAGNOSIS — M0609 Rheumatoid arthritis without rheumatoid factor, multiple sites: Secondary | ICD-10-CM | POA: Diagnosis not present

## 2017-08-25 IMAGING — CT CT RENAL STONE PROTOCOL
2 of 4 series · 14 of 46 positions shown, 16 images · non-contrast
Comparison: 09/17/2012

CLINICAL DATA: Left side abdominal pain starting 1 hour ago,
history of endometrial cancer with radiation and chemotherapy

EXAM:
CT ABDOMEN AND PELVIS WITHOUT CONTRAST
TECHNIQUE: Multidetector CT imaging of the abdomen and pelvis was performed
following the standard protocol without IV contrast.

[Series 2: stone full standard · axial · 0.79mm/px · z∈[-457,-87]mm · 11 of 90 slices shown, 13 images]
[im 8/90  soft-tissue]
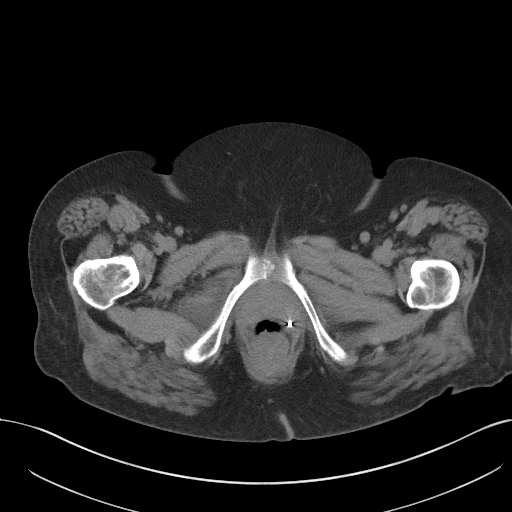
[im 8/90  bone]
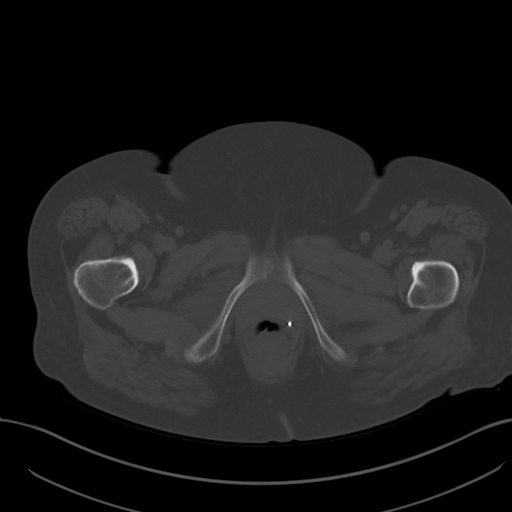
[im 15/90  soft-tissue]
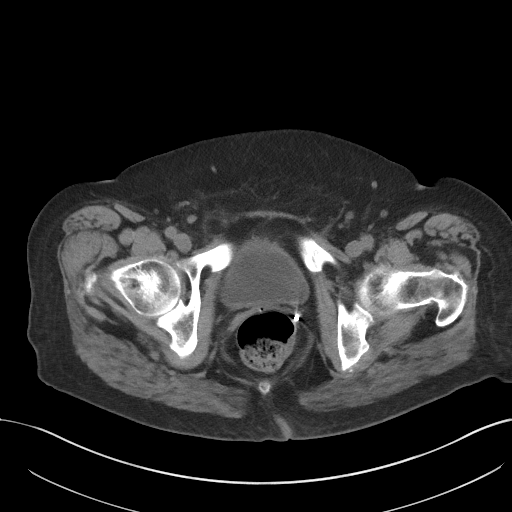
[im 23/90  soft-tissue]
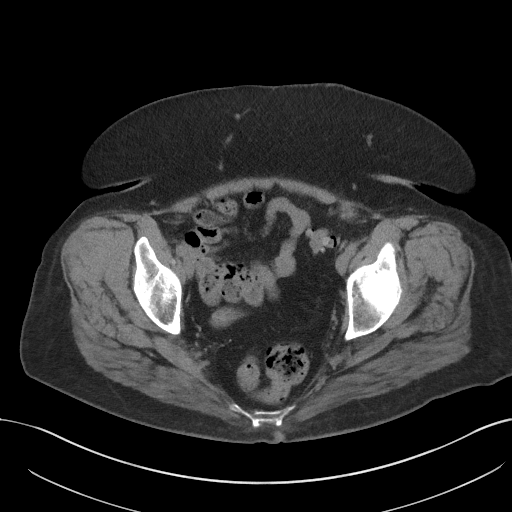
[im 30/90  soft-tissue]
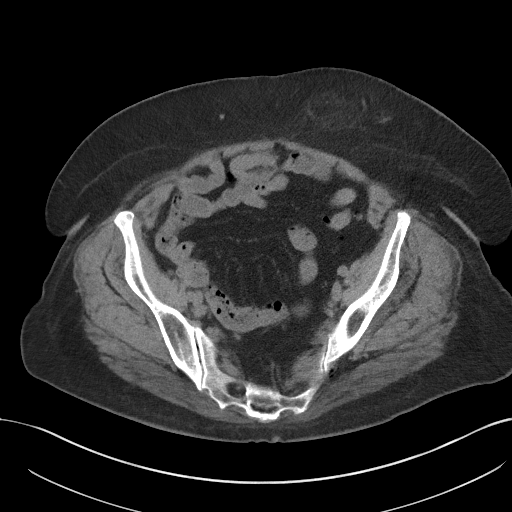
[im 38/90  soft-tissue]
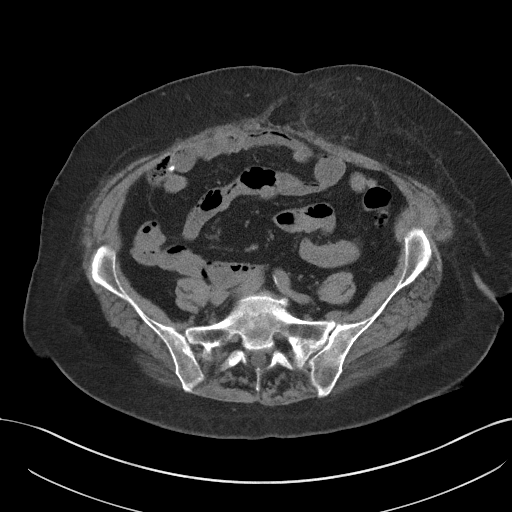
[im 45/90  soft-tissue]
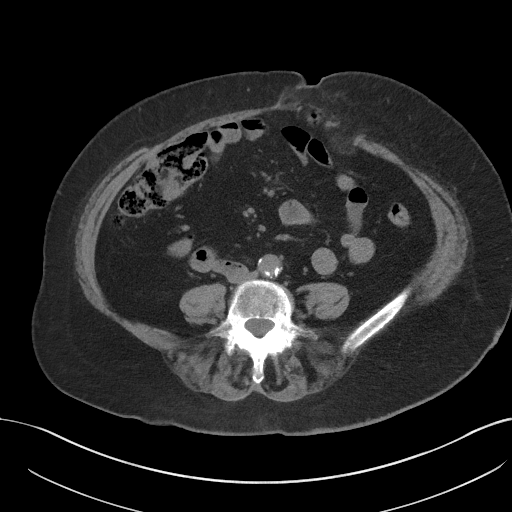
[im 52/90  soft-tissue]
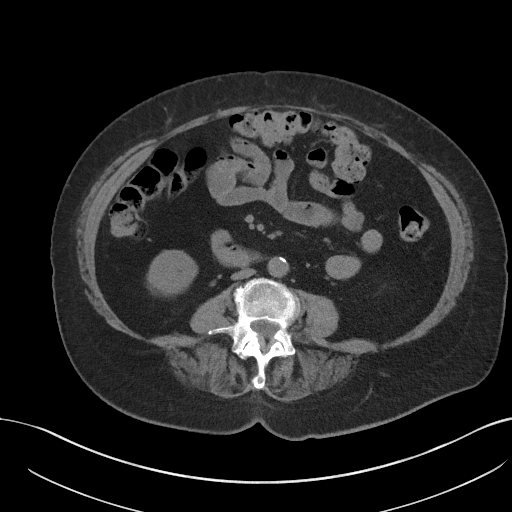
[im 60/90  soft-tissue]
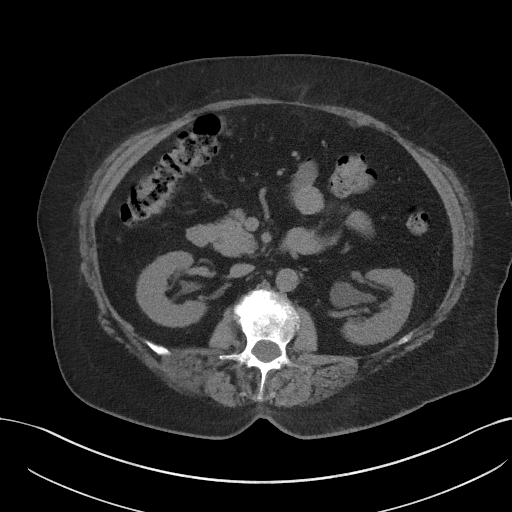
[im 67/90  soft-tissue]
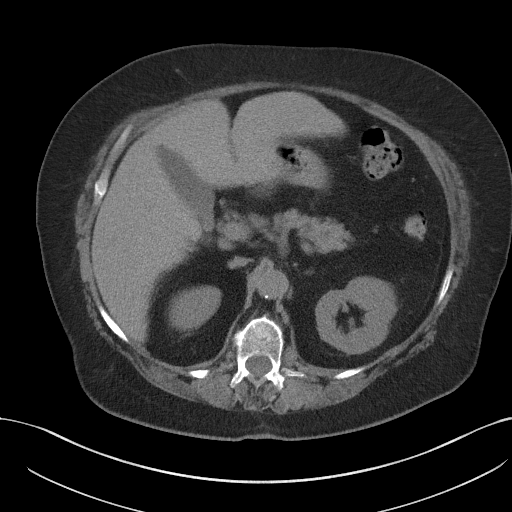
[im 67/90  bone]
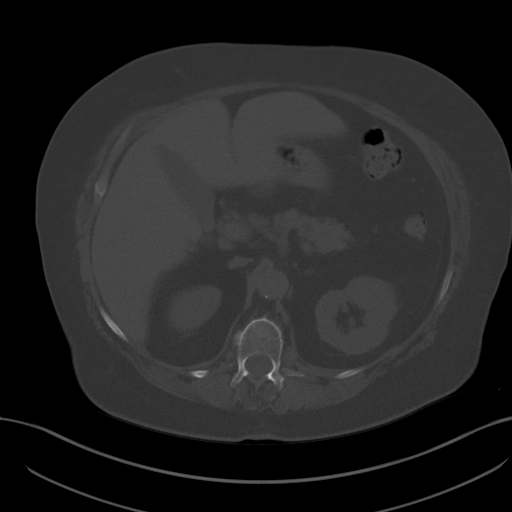
[im 75/90  soft-tissue]
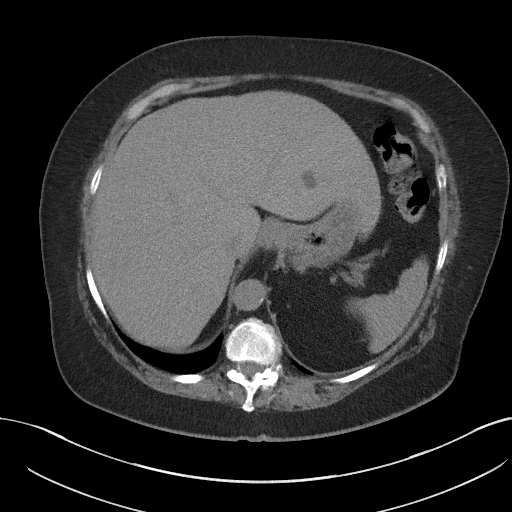
[im 82/90  soft-tissue]
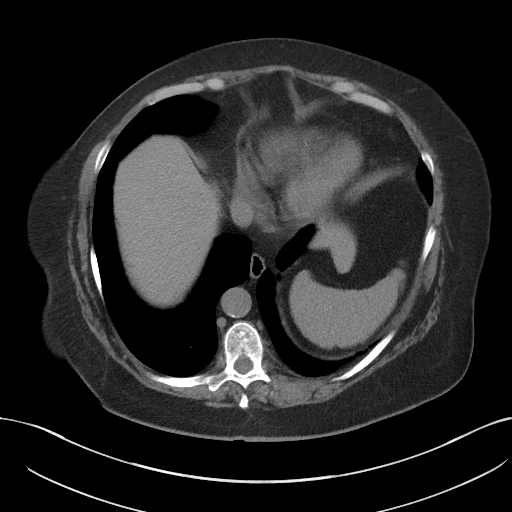

[Series 5: coronal · coronal · 0.75mm/px · 3 of 153 slices shown]
[im 51/153  soft-tissue]
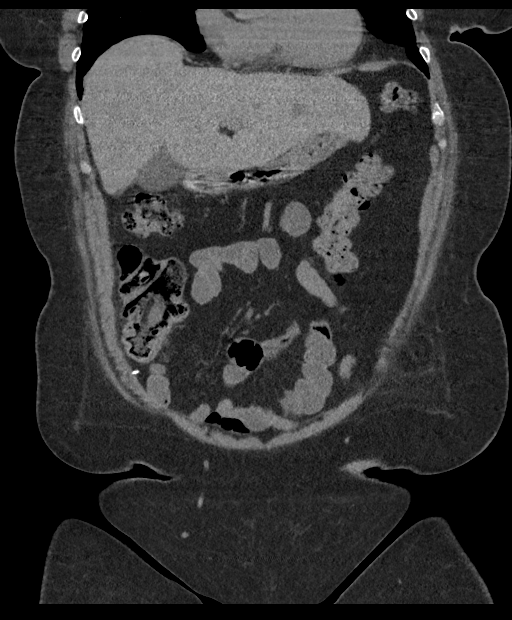
[im 68/153  soft-tissue]
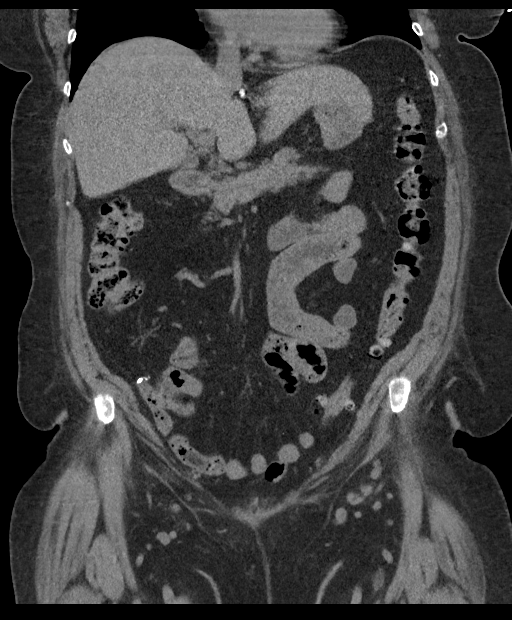
[im 85/153  soft-tissue]
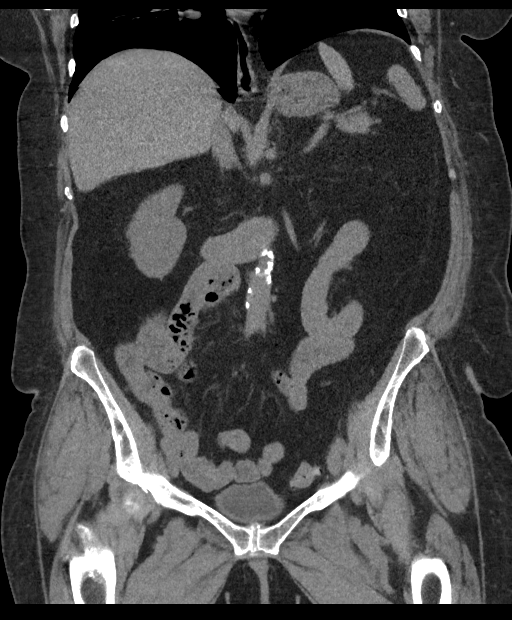

[14 of 46 positions shown; findings below may reference images not displayed]

FINDINGS: Lower chest: Lung bases shows no acute findings

Hepatobiliary: Unenhanced liver shows no biliary ductal dilatation.
Stable probable cyst in left hepatic lobe measures 1.2 cm. No
calcified gallstones are noted within gallbladder.

Pancreas: Unremarkable. No pancreatic ductal dilatation or
surrounding inflammatory changes.

Spleen: Normal in size without focal abnormality.

Adrenals/Urinary Tract: Again noted surgical absent right adrenal
gland. Left adrenal gland is stable without evidence of focal mass.
No hydronephrosis or hydroureter. There is a punctate nonobstructive
calcified calculus in upper pole of the right kidney measures 2 mm.
No calcified ureteral calculi are noted bilaterally. No calcified
calculi are noted within under distended urinary bladder no
thickening of urinary bladder wall.

Stomach/Bowel: No gastric outlet obstruction. No small bowel
obstruction. No pericecal inflammation. Stable postsurgical changes
in right lower quadrant probable prior appendectomy scattered
diverticula are noted descending colon. Multiple sigmoid colon
diverticula. No evidence of acute diverticulitis. No distal colitis.
Moderate stool noted in distal sigmoid colon. Some colonic stool and
gas noted within rectum. No distal colonic obstruction. Moderate
stool noted within transverse colon.

Vascular/Lymphatic: Atherosclerotic calcifications of abdominal
aorta and iliac arteries are noted. No aortic aneurysm. No
retroperitoneal or mesenteric adenopathy.

Reproductive: The patient is status post hysterectomy. No pelvic
mass or adenopathy.

Other: Again noted a left paramedian paraumbilical hernia containing
omental fat measures 6 cm without significant change from prior
exam. No definite evidence of acute inflammation. Axial image 46
there is minimal stranding of anterior omental fat at the base of
the hernia measures about 1.6 cm. Mild focal panniculitis or edema
at this level cannot be excluded. Clinical correlation is necessary.
No fluid collection or mesenteric abscess. There is a small
umbilical hernia containing fat sagittal image 98 without evidence
of acute complication.

Musculoskeletal: No destructive bony lesions are noted. Sagittal
images of the spine shows degenerative changes thoracolumbar spine.
Significant disc space flattening with endplate sclerotic changes
moderate anterior and mild posterior spurring at L2-L3 level. Mild
spinal canal stenosis due to posterior spurring at L2-L3 level.
IMPRESSION: 1. There is no evidence of acute inflammatory process within
abdomen. No hydronephrosis or hydroureter. Stable right
nonobstructive nephrolithiasis. No calcified ureteral calculi.
2. Stable probable cyst in left hepatic lobe measures 1.2 cm.
3. Colonic diverticula are noted descending colon. Multiple colonic
diverticulae are noted sigmoid colon. No evidence of acute colitis
or diverticulitis. No distal colonic obstruction.
4. Status post appendectomy.  Status post hysterectomy.
5. Again noted a left paramedian paraumbilical hernia containing
omental fat measures 6 cm without significant change from prior
exam. No definite evidence of acute inflammation. Axial image 46
there is minimal stranding of anterior omental fat at the base of
the hernia measures about 1.6 cm. Mild focal panniculitis or edema
at this level cannot be excluded. Clinical correlation is necessary.
No fluid collection or mesenteric abscess.

## 2018-05-23 DIAGNOSIS — M0609 Rheumatoid arthritis without rheumatoid factor, multiple sites: Secondary | ICD-10-CM | POA: Diagnosis not present

## 2018-05-27 DIAGNOSIS — R740 Nonspecific elevation of levels of transaminase and lactic acid dehydrogenase [LDH]: Secondary | ICD-10-CM | POA: Diagnosis not present

## 2018-05-27 DIAGNOSIS — Z6835 Body mass index (BMI) 35.0-35.9, adult: Secondary | ICD-10-CM | POA: Diagnosis not present

## 2018-05-27 DIAGNOSIS — E7849 Other hyperlipidemia: Secondary | ICD-10-CM | POA: Diagnosis not present

## 2018-05-27 DIAGNOSIS — E1142 Type 2 diabetes mellitus with diabetic polyneuropathy: Secondary | ICD-10-CM | POA: Diagnosis not present

## 2018-05-27 DIAGNOSIS — E559 Vitamin D deficiency, unspecified: Secondary | ICD-10-CM | POA: Diagnosis not present

## 2018-05-27 DIAGNOSIS — D3500 Benign neoplasm of unspecified adrenal gland: Secondary | ICD-10-CM | POA: Diagnosis not present

## 2018-05-27 DIAGNOSIS — M069 Rheumatoid arthritis, unspecified: Secondary | ICD-10-CM | POA: Diagnosis not present

## 2018-05-27 DIAGNOSIS — E1149 Type 2 diabetes mellitus with other diabetic neurological complication: Secondary | ICD-10-CM | POA: Diagnosis not present

## 2018-05-27 DIAGNOSIS — I10 Essential (primary) hypertension: Secondary | ICD-10-CM | POA: Diagnosis not present

## 2018-06-06 DIAGNOSIS — Z23 Encounter for immunization: Secondary | ICD-10-CM | POA: Diagnosis not present

## 2018-06-24 DIAGNOSIS — Z79899 Other long term (current) drug therapy: Secondary | ICD-10-CM | POA: Diagnosis not present

## 2018-06-24 DIAGNOSIS — E669 Obesity, unspecified: Secondary | ICD-10-CM | POA: Diagnosis not present

## 2018-06-24 DIAGNOSIS — M170 Bilateral primary osteoarthritis of knee: Secondary | ICD-10-CM | POA: Diagnosis not present

## 2018-06-24 DIAGNOSIS — M0609 Rheumatoid arthritis without rheumatoid factor, multiple sites: Secondary | ICD-10-CM | POA: Diagnosis not present

## 2018-06-24 DIAGNOSIS — L409 Psoriasis, unspecified: Secondary | ICD-10-CM | POA: Diagnosis not present

## 2018-06-24 DIAGNOSIS — M79643 Pain in unspecified hand: Secondary | ICD-10-CM | POA: Diagnosis not present

## 2018-06-24 DIAGNOSIS — M1990 Unspecified osteoarthritis, unspecified site: Secondary | ICD-10-CM | POA: Diagnosis not present

## 2018-06-27 DIAGNOSIS — M0609 Rheumatoid arthritis without rheumatoid factor, multiple sites: Secondary | ICD-10-CM | POA: Diagnosis not present

## 2018-07-25 DIAGNOSIS — M0609 Rheumatoid arthritis without rheumatoid factor, multiple sites: Secondary | ICD-10-CM | POA: Diagnosis not present

## 2018-08-22 DIAGNOSIS — M0609 Rheumatoid arthritis without rheumatoid factor, multiple sites: Secondary | ICD-10-CM | POA: Diagnosis not present

## 2018-09-24 DIAGNOSIS — M0609 Rheumatoid arthritis without rheumatoid factor, multiple sites: Secondary | ICD-10-CM | POA: Diagnosis not present

## 2018-09-24 DIAGNOSIS — E669 Obesity, unspecified: Secondary | ICD-10-CM | POA: Diagnosis not present

## 2018-09-24 DIAGNOSIS — L409 Psoriasis, unspecified: Secondary | ICD-10-CM | POA: Diagnosis not present

## 2018-09-24 DIAGNOSIS — M79643 Pain in unspecified hand: Secondary | ICD-10-CM | POA: Diagnosis not present

## 2018-09-24 DIAGNOSIS — Z79899 Other long term (current) drug therapy: Secondary | ICD-10-CM | POA: Diagnosis not present

## 2018-09-24 DIAGNOSIS — M1990 Unspecified osteoarthritis, unspecified site: Secondary | ICD-10-CM | POA: Diagnosis not present

## 2018-09-24 DIAGNOSIS — M170 Bilateral primary osteoarthritis of knee: Secondary | ICD-10-CM | POA: Diagnosis not present

## 2018-09-26 DIAGNOSIS — M0609 Rheumatoid arthritis without rheumatoid factor, multiple sites: Secondary | ICD-10-CM | POA: Diagnosis not present

## 2018-10-02 DIAGNOSIS — R42 Dizziness and giddiness: Secondary | ICD-10-CM | POA: Diagnosis not present

## 2018-10-02 DIAGNOSIS — E785 Hyperlipidemia, unspecified: Secondary | ICD-10-CM | POA: Diagnosis not present

## 2018-10-02 DIAGNOSIS — Z87442 Personal history of urinary calculi: Secondary | ICD-10-CM | POA: Diagnosis not present

## 2018-10-02 DIAGNOSIS — E669 Obesity, unspecified: Secondary | ICD-10-CM | POA: Diagnosis not present

## 2018-10-02 DIAGNOSIS — E559 Vitamin D deficiency, unspecified: Secondary | ICD-10-CM | POA: Diagnosis not present

## 2018-10-02 DIAGNOSIS — M069 Rheumatoid arthritis, unspecified: Secondary | ICD-10-CM | POA: Diagnosis not present

## 2018-10-02 DIAGNOSIS — E1142 Type 2 diabetes mellitus with diabetic polyneuropathy: Secondary | ICD-10-CM | POA: Diagnosis not present

## 2018-10-02 DIAGNOSIS — E1149 Type 2 diabetes mellitus with other diabetic neurological complication: Secondary | ICD-10-CM | POA: Diagnosis not present

## 2018-10-02 DIAGNOSIS — I10 Essential (primary) hypertension: Secondary | ICD-10-CM | POA: Diagnosis not present

## 2018-10-02 DIAGNOSIS — D3500 Benign neoplasm of unspecified adrenal gland: Secondary | ICD-10-CM | POA: Diagnosis not present

## 2018-10-02 DIAGNOSIS — C541 Malignant neoplasm of endometrium: Secondary | ICD-10-CM | POA: Diagnosis not present

## 2018-10-10 DIAGNOSIS — Z9842 Cataract extraction status, left eye: Secondary | ICD-10-CM | POA: Diagnosis not present

## 2018-10-10 DIAGNOSIS — E119 Type 2 diabetes mellitus without complications: Secondary | ICD-10-CM | POA: Diagnosis not present

## 2018-10-10 DIAGNOSIS — Z9841 Cataract extraction status, right eye: Secondary | ICD-10-CM | POA: Diagnosis not present

## 2018-10-10 DIAGNOSIS — H04123 Dry eye syndrome of bilateral lacrimal glands: Secondary | ICD-10-CM | POA: Diagnosis not present

## 2018-10-10 DIAGNOSIS — H40023 Open angle with borderline findings, high risk, bilateral: Secondary | ICD-10-CM | POA: Diagnosis not present

## 2018-10-10 DIAGNOSIS — H16223 Keratoconjunctivitis sicca, not specified as Sjogren's, bilateral: Secondary | ICD-10-CM | POA: Diagnosis not present

## 2018-11-07 DIAGNOSIS — M0609 Rheumatoid arthritis without rheumatoid factor, multiple sites: Secondary | ICD-10-CM | POA: Diagnosis not present

## 2018-11-12 DIAGNOSIS — H04123 Dry eye syndrome of bilateral lacrimal glands: Secondary | ICD-10-CM | POA: Diagnosis not present

## 2018-11-12 DIAGNOSIS — H16223 Keratoconjunctivitis sicca, not specified as Sjogren's, bilateral: Secondary | ICD-10-CM | POA: Diagnosis not present

## 2018-11-14 DIAGNOSIS — E559 Vitamin D deficiency, unspecified: Secondary | ICD-10-CM | POA: Diagnosis not present

## 2018-11-14 DIAGNOSIS — E7849 Other hyperlipidemia: Secondary | ICD-10-CM | POA: Diagnosis not present

## 2018-11-14 DIAGNOSIS — E1149 Type 2 diabetes mellitus with other diabetic neurological complication: Secondary | ICD-10-CM | POA: Diagnosis not present

## 2018-12-12 DIAGNOSIS — M0609 Rheumatoid arthritis without rheumatoid factor, multiple sites: Secondary | ICD-10-CM | POA: Diagnosis not present

## 2019-01-09 DIAGNOSIS — M0609 Rheumatoid arthritis without rheumatoid factor, multiple sites: Secondary | ICD-10-CM | POA: Diagnosis not present

## 2019-01-28 DIAGNOSIS — Z79899 Other long term (current) drug therapy: Secondary | ICD-10-CM | POA: Diagnosis not present

## 2019-01-28 DIAGNOSIS — M0609 Rheumatoid arthritis without rheumatoid factor, multiple sites: Secondary | ICD-10-CM | POA: Diagnosis not present

## 2019-01-28 DIAGNOSIS — M170 Bilateral primary osteoarthritis of knee: Secondary | ICD-10-CM | POA: Diagnosis not present

## 2019-01-28 DIAGNOSIS — L409 Psoriasis, unspecified: Secondary | ICD-10-CM | POA: Diagnosis not present

## 2019-01-28 DIAGNOSIS — M47816 Spondylosis without myelopathy or radiculopathy, lumbar region: Secondary | ICD-10-CM | POA: Diagnosis not present

## 2019-01-28 DIAGNOSIS — M545 Low back pain: Secondary | ICD-10-CM | POA: Diagnosis not present

## 2019-01-28 DIAGNOSIS — M25559 Pain in unspecified hip: Secondary | ICD-10-CM | POA: Diagnosis not present

## 2019-01-28 DIAGNOSIS — M1990 Unspecified osteoarthritis, unspecified site: Secondary | ICD-10-CM | POA: Diagnosis not present

## 2019-01-28 DIAGNOSIS — R1030 Lower abdominal pain, unspecified: Secondary | ICD-10-CM | POA: Diagnosis not present

## 2019-01-28 DIAGNOSIS — E669 Obesity, unspecified: Secondary | ICD-10-CM | POA: Diagnosis not present

## 2019-01-29 DIAGNOSIS — Z23 Encounter for immunization: Secondary | ICD-10-CM | POA: Diagnosis not present

## 2019-02-05 DIAGNOSIS — C541 Malignant neoplasm of endometrium: Secondary | ICD-10-CM | POA: Diagnosis not present

## 2019-02-05 DIAGNOSIS — Z87442 Personal history of urinary calculi: Secondary | ICD-10-CM | POA: Diagnosis not present

## 2019-02-05 DIAGNOSIS — E669 Obesity, unspecified: Secondary | ICD-10-CM | POA: Diagnosis not present

## 2019-02-05 DIAGNOSIS — E785 Hyperlipidemia, unspecified: Secondary | ICD-10-CM | POA: Diagnosis not present

## 2019-02-05 DIAGNOSIS — I10 Essential (primary) hypertension: Secondary | ICD-10-CM | POA: Diagnosis not present

## 2019-02-05 DIAGNOSIS — E1142 Type 2 diabetes mellitus with diabetic polyneuropathy: Secondary | ICD-10-CM | POA: Diagnosis not present

## 2019-02-05 DIAGNOSIS — I700 Atherosclerosis of aorta: Secondary | ICD-10-CM | POA: Diagnosis not present

## 2019-02-05 DIAGNOSIS — D8989 Other specified disorders involving the immune mechanism, not elsewhere classified: Secondary | ICD-10-CM | POA: Diagnosis not present

## 2019-02-05 DIAGNOSIS — E559 Vitamin D deficiency, unspecified: Secondary | ICD-10-CM | POA: Diagnosis not present

## 2019-02-05 DIAGNOSIS — M069 Rheumatoid arthritis, unspecified: Secondary | ICD-10-CM | POA: Diagnosis not present

## 2019-02-05 DIAGNOSIS — E1149 Type 2 diabetes mellitus with other diabetic neurological complication: Secondary | ICD-10-CM | POA: Diagnosis not present

## 2019-02-05 DIAGNOSIS — R740 Nonspecific elevation of levels of transaminase and lactic acid dehydrogenase [LDH]: Secondary | ICD-10-CM | POA: Diagnosis not present

## 2019-02-06 DIAGNOSIS — M0609 Rheumatoid arthritis without rheumatoid factor, multiple sites: Secondary | ICD-10-CM | POA: Diagnosis not present

## 2019-02-11 DIAGNOSIS — M25561 Pain in right knee: Secondary | ICD-10-CM | POA: Diagnosis not present

## 2019-02-11 DIAGNOSIS — Z96651 Presence of right artificial knee joint: Secondary | ICD-10-CM | POA: Diagnosis not present

## 2019-03-05 DIAGNOSIS — M25561 Pain in right knee: Secondary | ICD-10-CM | POA: Diagnosis not present

## 2019-03-06 DIAGNOSIS — M0609 Rheumatoid arthritis without rheumatoid factor, multiple sites: Secondary | ICD-10-CM | POA: Diagnosis not present

## 2019-03-11 DIAGNOSIS — M25561 Pain in right knee: Secondary | ICD-10-CM | POA: Diagnosis not present

## 2019-03-17 DIAGNOSIS — M25561 Pain in right knee: Secondary | ICD-10-CM | POA: Diagnosis not present

## 2019-03-20 DIAGNOSIS — M25561 Pain in right knee: Secondary | ICD-10-CM | POA: Diagnosis not present

## 2019-03-24 DIAGNOSIS — M25561 Pain in right knee: Secondary | ICD-10-CM | POA: Diagnosis not present

## 2019-03-27 DIAGNOSIS — M25561 Pain in right knee: Secondary | ICD-10-CM | POA: Diagnosis not present

## 2019-03-31 DIAGNOSIS — M25561 Pain in right knee: Secondary | ICD-10-CM | POA: Diagnosis not present

## 2019-04-02 DIAGNOSIS — M25561 Pain in right knee: Secondary | ICD-10-CM | POA: Diagnosis not present

## 2019-04-14 DIAGNOSIS — M25561 Pain in right knee: Secondary | ICD-10-CM | POA: Diagnosis not present

## 2019-04-16 DIAGNOSIS — M25561 Pain in right knee: Secondary | ICD-10-CM | POA: Diagnosis not present

## 2019-04-17 DIAGNOSIS — M0609 Rheumatoid arthritis without rheumatoid factor, multiple sites: Secondary | ICD-10-CM | POA: Diagnosis not present

## 2019-04-23 DIAGNOSIS — L03031 Cellulitis of right toe: Secondary | ICD-10-CM | POA: Diagnosis not present

## 2019-05-20 DIAGNOSIS — E669 Obesity, unspecified: Secondary | ICD-10-CM | POA: Diagnosis not present

## 2019-05-20 DIAGNOSIS — Z79899 Other long term (current) drug therapy: Secondary | ICD-10-CM | POA: Diagnosis not present

## 2019-05-20 DIAGNOSIS — M1990 Unspecified osteoarthritis, unspecified site: Secondary | ICD-10-CM | POA: Diagnosis not present

## 2019-05-20 DIAGNOSIS — L409 Psoriasis, unspecified: Secondary | ICD-10-CM | POA: Diagnosis not present

## 2019-05-20 DIAGNOSIS — M170 Bilateral primary osteoarthritis of knee: Secondary | ICD-10-CM | POA: Diagnosis not present

## 2019-05-20 DIAGNOSIS — M7989 Other specified soft tissue disorders: Secondary | ICD-10-CM | POA: Diagnosis not present

## 2019-05-20 DIAGNOSIS — M545 Low back pain: Secondary | ICD-10-CM | POA: Diagnosis not present

## 2019-05-20 DIAGNOSIS — L600 Ingrowing nail: Secondary | ICD-10-CM | POA: Diagnosis not present

## 2019-05-20 DIAGNOSIS — M0609 Rheumatoid arthritis without rheumatoid factor, multiple sites: Secondary | ICD-10-CM | POA: Diagnosis not present

## 2019-05-21 ENCOUNTER — Ambulatory Visit: Payer: Medicare Other | Admitting: Podiatry

## 2019-05-21 ENCOUNTER — Ambulatory Visit (INDEPENDENT_AMBULATORY_CARE_PROVIDER_SITE_OTHER): Payer: Medicare Other | Admitting: Podiatry

## 2019-05-21 ENCOUNTER — Other Ambulatory Visit: Payer: Self-pay

## 2019-05-21 ENCOUNTER — Encounter: Payer: Self-pay | Admitting: Podiatry

## 2019-05-21 VITALS — BP 176/106 | HR 112 | Resp 16

## 2019-05-21 DIAGNOSIS — L600 Ingrowing nail: Secondary | ICD-10-CM

## 2019-05-21 MED ORDER — NEOMYCIN-POLYMYXIN-HC 1 % OT SOLN: OTIC | 1 refills | Status: DC

## 2019-05-21 NOTE — Progress Notes (Signed)
Subjective:  Patient ID: TASHAWN GRESSEL, female    DOB: 18-Sep-1945,  MRN: [id number] HPI Chief Complaint  Patient presents with  . Toe Pain    Hallux right - lateral border, tender, red, swollen x 3-4 weeks, taken antibiotic, not getting better  . New Patient (Initial Visit)    74 y.o. female presents with the above complaint.   ROS: Denies fever chills nausea vomiting muscle aches pains calf pain back pain chest pain shortness of breath.  Past Medical History:  Diagnosis Date  . Arthritis   . Cancer ([place])   . Cataract      Current Outpatient Medications:  .  Abatacept (ORENCIA) 125 MG/ML SOSY, Inject 125 mg into the skin every 30 (thirty) days., Disp: , Rfl:  .  atorvastatin (LIPITOR) 40 MG tablet, Take 1 tablet by mouth daily., Disp: , Rfl:  .  clobetasol ointment (TEMOVATE) [id number] %, Apply 1 application topically 2 (two) times daily., Disp: , Rfl:  .  CONTOUR NEXT TEST test strip, USE TO SELF MONITOR BLOOD GLUCOSE DAILY DX E11.9, Disp: , Rfl:  .  cycloSPORINE (RESTASIS) 0.05 % ophthalmic emulsion, Place 1 drop into both eyes 2 (two) times daily. , Disp: , Rfl:  .  diphenhydramine-acetaminophen (TYLENOL PM) 25-500 MG TABS tablet, Take 1 tablet by mouth at bedtime., Disp: , Rfl:  .  ergocalciferol (VITAMIN D2) 50000 units capsule, Take 50,000 Units by mouth once a week., Disp: , Rfl:  .  furosemide (LASIX) 40 MG tablet, Take 0.5 tablets by mouth daily., Disp: , Rfl:  .  leflunomide (ARAVA) 10 MG tablet, Take 10 mg by mouth daily., Disp: , Rfl:  .  lisinopril (PRINIVIL,ZESTRIL) 10 MG tablet, Take 1 tablet by mouth daily., Disp: , Rfl:  .  metFORMIN (GLUCOPHAGE-XR) 500 MG 24 hr tablet, Take 1 tablet by mouth 2 (two) times daily., Disp: , Rfl:  .  NEOMYCIN-POLYMYXIN-HYDROCORTISONE (CORTISPORIN) 1 % SOLN OTIC solution, Apply 1-2 drops to toe BID after soaking, Disp: 10 mL, Rfl: 1 .  triamcinolone ointment (KENALOG) 0.1 %, Apply 1 application topically 2 (two) times daily., Disp:  , Rfl:   Allergies  Allergen Reactions  . Hydrocodone-Acetaminophen Hives  . Oxycodone-Acetaminophen Hives  . Aspirin Rash    Other reaction(s): Unknown   Review of Systems Objective:   Vitals:   05/21/19 1515  BP: (!) 176/106  Pulse: (!) 112  Resp: 16    General: Well developed, nourished, in no acute distress, alert and oriented x3   Dermatological: Skin is warm, dry and supple bilateral. Nails x 10 are well maintained; remaining integument appears unremarkable at this time. There are no open sores, no preulcerative lesions, no rash or signs of infection present.  Sharp incurvated nail margin fibular border of the hallux right exquisitely tender on palpation with cellulitis extending to the level of the interphalangeal joint.  Vascular: Dorsalis Pedis artery and Posterior Tibial artery pedal pulses are 2/4 bilateral with immedate capillary fill time. Pedal hair growth present. No varicosities and no lower extremity edema present bilateral.   Neruologic: Grossly intact via light touch bilateral. Vibratory intact via tuning fork bilateral. Protective threshold with Semmes Wienstein monofilament intact to all pedal sites bilateral. Patellar and Achilles deep tendon reflexes 2+ bilateral. No Babinski or clonus noted bilateral.   Musculoskeletal: No gross boney pedal deformities bilateral. No pain, crepitus, or limitation noted with foot and ankle range of motion bilateral. Muscular strength 5/5 in all groups tested bilateral.  Gait: Unassisted, Nonantalgic.  Radiographs:  None taken  Assessment & Plan:   Assessment: Ingrown nail fibular border hallux right  Plan: Discussed etiology pathology conservative versus surgical therapies chemical matrixectomy was performed today after local anesthesia was administered.  She tolerated procedure well without complications.  She was provided with both oral and written home-going instructions for care and soaking of the toe as well as a  prescription for Corticosporin otic to be applied twice daily after soaking.  She also received an appointment for 2 weeks I will follow-up with her at that time.     Aydia Maj T. Goodman, [place]

## 2019-05-21 NOTE — Patient Instructions (Signed)

## 2019-05-26 DIAGNOSIS — M069 Rheumatoid arthritis, unspecified: Secondary | ICD-10-CM | POA: Diagnosis not present

## 2019-05-26 DIAGNOSIS — Z87442 Personal history of urinary calculi: Secondary | ICD-10-CM | POA: Diagnosis not present

## 2019-05-26 DIAGNOSIS — I10 Essential (primary) hypertension: Secondary | ICD-10-CM | POA: Diagnosis not present

## 2019-05-26 DIAGNOSIS — E669 Obesity, unspecified: Secondary | ICD-10-CM | POA: Diagnosis not present

## 2019-05-26 DIAGNOSIS — L989 Disorder of the skin and subcutaneous tissue, unspecified: Secondary | ICD-10-CM | POA: Diagnosis not present

## 2019-05-26 DIAGNOSIS — E7849 Other hyperlipidemia: Secondary | ICD-10-CM | POA: Diagnosis not present

## 2019-05-26 DIAGNOSIS — D3500 Benign neoplasm of unspecified adrenal gland: Secondary | ICD-10-CM | POA: Diagnosis not present

## 2019-05-26 DIAGNOSIS — E559 Vitamin D deficiency, unspecified: Secondary | ICD-10-CM | POA: Diagnosis not present

## 2019-05-26 DIAGNOSIS — I700 Atherosclerosis of aorta: Secondary | ICD-10-CM | POA: Diagnosis not present

## 2019-05-26 DIAGNOSIS — E1142 Type 2 diabetes mellitus with diabetic polyneuropathy: Secondary | ICD-10-CM | POA: Diagnosis not present

## 2019-05-26 DIAGNOSIS — E1149 Type 2 diabetes mellitus with other diabetic neurological complication: Secondary | ICD-10-CM | POA: Diagnosis not present

## 2019-05-26 DIAGNOSIS — R7402 Elevation of levels of lactic acid dehydrogenase (LDH): Secondary | ICD-10-CM | POA: Diagnosis not present

## 2019-05-27 DIAGNOSIS — L989 Disorder of the skin and subcutaneous tissue, unspecified: Secondary | ICD-10-CM | POA: Diagnosis not present

## 2019-05-27 DIAGNOSIS — E7849 Other hyperlipidemia: Secondary | ICD-10-CM | POA: Diagnosis not present

## 2019-06-03 DIAGNOSIS — L570 Actinic keratosis: Secondary | ICD-10-CM | POA: Diagnosis not present

## 2019-06-03 DIAGNOSIS — L309 Dermatitis, unspecified: Secondary | ICD-10-CM | POA: Diagnosis not present

## 2019-06-04 ENCOUNTER — Encounter: Payer: Self-pay | Admitting: Podiatry

## 2019-06-04 ENCOUNTER — Other Ambulatory Visit: Payer: Self-pay

## 2019-06-04 ENCOUNTER — Ambulatory Visit (INDEPENDENT_AMBULATORY_CARE_PROVIDER_SITE_OTHER): Payer: Medicare Other | Admitting: Podiatry

## 2019-06-04 DIAGNOSIS — L600 Ingrowing nail: Secondary | ICD-10-CM

## 2019-06-04 DIAGNOSIS — Z9889 Other specified postprocedural states: Secondary | ICD-10-CM

## 2019-06-04 NOTE — Progress Notes (Signed)
She presents today for follow-up of her matrixectomy of her right hallux.  She states that she has been soaking in Epson salts and warm water.  Objective: Vital signs are stable she is alert oriented x3 no erythema edema cellulitis drainage or odor.  Assessment: Well-healing surgical toe.  Plan: Epson salt soaks every other day cover with a Band-Aid during the daytime leave open at night continue to soak until completely well for the next week or so.

## 2019-06-25 DIAGNOSIS — M0609 Rheumatoid arthritis without rheumatoid factor, multiple sites: Secondary | ICD-10-CM | POA: Diagnosis not present

## 2019-07-17 ENCOUNTER — Ambulatory Visit: Payer: Medicare Other

## 2019-07-24 ENCOUNTER — Ambulatory Visit: Payer: Medicare Other | Attending: Internal Medicine

## 2019-07-24 DIAGNOSIS — Z23 Encounter for immunization: Secondary | ICD-10-CM

## 2019-07-24 NOTE — Progress Notes (Signed)
   Covid-19 Vaccination Clinic  Name:  Tammy Ashley    MRN: [id number] DOB: [birth year]  07/24/2019  Ms. Kluck was observed post Covid-19 immunization for 15 minutes without incident. She was provided with Vaccine Information Sheet and instruction to access the V-Safe system.   Ms. See was instructed to call 911 with any severe reactions post vaccine: Marland Kitchen Difficulty breathing  . Swelling of face and throat  . A fast heartbeat  . A bad rash all over body  . Dizziness and weakness   Immunizations Administered    Name Date Dose VIS Date Route   Pfizer COVID-19 Vaccine 07/24/2019 10:13 AM 0.3 mL 04/25/2019 Intramuscular   Manufacturer: [place]   Lot: [id number]   [place]: [id number]

## 2019-08-04 DIAGNOSIS — M0609 Rheumatoid arthritis without rheumatoid factor, multiple sites: Secondary | ICD-10-CM | POA: Diagnosis not present

## 2019-08-05 DIAGNOSIS — E119 Type 2 diabetes mellitus without complications: Secondary | ICD-10-CM | POA: Diagnosis not present

## 2019-08-19 ENCOUNTER — Ambulatory Visit: Payer: Medicare Other | Attending: Internal Medicine

## 2019-08-19 DIAGNOSIS — Z23 Encounter for immunization: Secondary | ICD-10-CM

## 2019-08-19 NOTE — Progress Notes (Signed)
   Covid-19 Vaccination Clinic  Name:  Tammy Ashley    MRN: [id number] DOB: 12/29/1945  08/19/2019  Tammy Ashley was observed post Covid-19 immunization for 15 minutes without incident. She was provided with Vaccine Information Sheet and instruction to access the V-Safe system.   Tammy Ashley was instructed to call 911 with any severe reactions post vaccine: Marland Kitchen Difficulty breathing  . Swelling of face and throat  . A fast heartbeat  . A bad rash all over body  . Dizziness and weakness   Immunizations Administered    Name Date Dose VIS Date Route   Pfizer COVID-19 Vaccine 08/19/2019 10:40 AM 0.3 mL 04/25/2019 Intramuscular   Manufacturer: [place]   Lot: [id number]   [place]: [id number]

## 2019-09-04 DIAGNOSIS — M0609 Rheumatoid arthritis without rheumatoid factor, multiple sites: Secondary | ICD-10-CM | POA: Diagnosis not present

## 2019-09-26 DIAGNOSIS — E559 Vitamin D deficiency, unspecified: Secondary | ICD-10-CM | POA: Diagnosis not present

## 2019-09-26 DIAGNOSIS — C541 Malignant neoplasm of endometrium: Secondary | ICD-10-CM | POA: Diagnosis not present

## 2019-09-26 DIAGNOSIS — E669 Obesity, unspecified: Secondary | ICD-10-CM | POA: Diagnosis not present

## 2019-09-26 DIAGNOSIS — M069 Rheumatoid arthritis, unspecified: Secondary | ICD-10-CM | POA: Diagnosis not present

## 2019-09-26 DIAGNOSIS — I10 Essential (primary) hypertension: Secondary | ICD-10-CM | POA: Diagnosis not present

## 2019-09-26 DIAGNOSIS — Z1389 Encounter for screening for other disorder: Secondary | ICD-10-CM | POA: Diagnosis not present

## 2019-09-26 DIAGNOSIS — E1149 Type 2 diabetes mellitus with other diabetic neurological complication: Secondary | ICD-10-CM | POA: Diagnosis not present

## 2019-09-26 DIAGNOSIS — D3500 Benign neoplasm of unspecified adrenal gland: Secondary | ICD-10-CM | POA: Diagnosis not present

## 2019-09-26 DIAGNOSIS — I700 Atherosclerosis of aorta: Secondary | ICD-10-CM | POA: Diagnosis not present

## 2019-09-26 DIAGNOSIS — E7849 Other hyperlipidemia: Secondary | ICD-10-CM | POA: Diagnosis not present

## 2019-09-26 DIAGNOSIS — E1142 Type 2 diabetes mellitus with diabetic polyneuropathy: Secondary | ICD-10-CM | POA: Diagnosis not present

## 2019-09-30 DIAGNOSIS — E7849 Other hyperlipidemia: Secondary | ICD-10-CM | POA: Diagnosis not present

## 2019-09-30 DIAGNOSIS — E1149 Type 2 diabetes mellitus with other diabetic neurological complication: Secondary | ICD-10-CM | POA: Diagnosis not present

## 2019-09-30 DIAGNOSIS — E559 Vitamin D deficiency, unspecified: Secondary | ICD-10-CM | POA: Diagnosis not present

## 2019-10-02 DIAGNOSIS — M0609 Rheumatoid arthritis without rheumatoid factor, multiple sites: Secondary | ICD-10-CM | POA: Diagnosis not present

## 2019-11-03 DIAGNOSIS — M0609 Rheumatoid arthritis without rheumatoid factor, multiple sites: Secondary | ICD-10-CM | POA: Diagnosis not present

## 2019-11-06 DIAGNOSIS — E119 Type 2 diabetes mellitus without complications: Secondary | ICD-10-CM | POA: Diagnosis not present

## 2019-11-06 DIAGNOSIS — Z9841 Cataract extraction status, right eye: Secondary | ICD-10-CM | POA: Diagnosis not present

## 2019-11-06 DIAGNOSIS — H04123 Dry eye syndrome of bilateral lacrimal glands: Secondary | ICD-10-CM | POA: Diagnosis not present

## 2019-11-06 DIAGNOSIS — H16223 Keratoconjunctivitis sicca, not specified as Sjogren's, bilateral: Secondary | ICD-10-CM | POA: Diagnosis not present

## 2019-11-06 DIAGNOSIS — Z9842 Cataract extraction status, left eye: Secondary | ICD-10-CM | POA: Diagnosis not present

## 2019-11-06 DIAGNOSIS — H40023 Open angle with borderline findings, high risk, bilateral: Secondary | ICD-10-CM | POA: Diagnosis not present

## 2019-11-06 DIAGNOSIS — H35371 Puckering of macula, right eye: Secondary | ICD-10-CM | POA: Diagnosis not present

## 2019-11-13 DIAGNOSIS — Z79899 Other long term (current) drug therapy: Secondary | ICD-10-CM | POA: Diagnosis not present

## 2019-11-13 DIAGNOSIS — M170 Bilateral primary osteoarthritis of knee: Secondary | ICD-10-CM | POA: Diagnosis not present

## 2019-11-13 DIAGNOSIS — L409 Psoriasis, unspecified: Secondary | ICD-10-CM | POA: Diagnosis not present

## 2019-11-13 DIAGNOSIS — L600 Ingrowing nail: Secondary | ICD-10-CM | POA: Diagnosis not present

## 2019-11-13 DIAGNOSIS — M545 Low back pain: Secondary | ICD-10-CM | POA: Diagnosis not present

## 2019-11-13 DIAGNOSIS — M7989 Other specified soft tissue disorders: Secondary | ICD-10-CM | POA: Diagnosis not present

## 2019-11-13 DIAGNOSIS — M0609 Rheumatoid arthritis without rheumatoid factor, multiple sites: Secondary | ICD-10-CM | POA: Diagnosis not present

## 2019-11-13 DIAGNOSIS — M1990 Unspecified osteoarthritis, unspecified site: Secondary | ICD-10-CM | POA: Diagnosis not present

## 2019-11-13 DIAGNOSIS — E669 Obesity, unspecified: Secondary | ICD-10-CM | POA: Diagnosis not present

## 2019-12-03 DIAGNOSIS — M0609 Rheumatoid arthritis without rheumatoid factor, multiple sites: Secondary | ICD-10-CM | POA: Diagnosis not present

## 2020-01-05 DIAGNOSIS — M0609 Rheumatoid arthritis without rheumatoid factor, multiple sites: Secondary | ICD-10-CM | POA: Diagnosis not present

## 2020-01-16 DIAGNOSIS — L408 Other psoriasis: Secondary | ICD-10-CM | POA: Diagnosis not present

## 2020-01-16 DIAGNOSIS — L400 Psoriasis vulgaris: Secondary | ICD-10-CM | POA: Diagnosis not present

## 2020-01-16 DIAGNOSIS — L918 Other hypertrophic disorders of the skin: Secondary | ICD-10-CM | POA: Diagnosis not present

## 2020-01-16 DIAGNOSIS — D485 Neoplasm of uncertain behavior of skin: Secondary | ICD-10-CM | POA: Diagnosis not present

## 2020-01-16 DIAGNOSIS — R58 Hemorrhage, not elsewhere classified: Secondary | ICD-10-CM | POA: Diagnosis not present

## 2020-01-16 DIAGNOSIS — C44619 Basal cell carcinoma of skin of left upper limb, including shoulder: Secondary | ICD-10-CM | POA: Diagnosis not present

## 2020-02-13 DIAGNOSIS — E559 Vitamin D deficiency, unspecified: Secondary | ICD-10-CM | POA: Diagnosis not present

## 2020-02-13 DIAGNOSIS — E785 Hyperlipidemia, unspecified: Secondary | ICD-10-CM | POA: Diagnosis not present

## 2020-02-13 DIAGNOSIS — I10 Essential (primary) hypertension: Secondary | ICD-10-CM | POA: Diagnosis not present

## 2020-02-13 DIAGNOSIS — M0609 Rheumatoid arthritis without rheumatoid factor, multiple sites: Secondary | ICD-10-CM | POA: Diagnosis not present

## 2020-02-13 DIAGNOSIS — C541 Malignant neoplasm of endometrium: Secondary | ICD-10-CM | POA: Diagnosis not present

## 2020-02-13 DIAGNOSIS — D3500 Benign neoplasm of unspecified adrenal gland: Secondary | ICD-10-CM | POA: Diagnosis not present

## 2020-02-13 DIAGNOSIS — I700 Atherosclerosis of aorta: Secondary | ICD-10-CM | POA: Diagnosis not present

## 2020-02-13 DIAGNOSIS — E669 Obesity, unspecified: Secondary | ICD-10-CM | POA: Diagnosis not present

## 2020-02-13 DIAGNOSIS — M069 Rheumatoid arthritis, unspecified: Secondary | ICD-10-CM | POA: Diagnosis not present

## 2020-02-13 DIAGNOSIS — F5101 Primary insomnia: Secondary | ICD-10-CM | POA: Diagnosis not present

## 2020-02-13 DIAGNOSIS — J309 Allergic rhinitis, unspecified: Secondary | ICD-10-CM | POA: Diagnosis not present

## 2020-02-13 DIAGNOSIS — E1142 Type 2 diabetes mellitus with diabetic polyneuropathy: Secondary | ICD-10-CM | POA: Diagnosis not present

## 2020-02-13 DIAGNOSIS — M179 Osteoarthritis of knee, unspecified: Secondary | ICD-10-CM | POA: Diagnosis not present

## 2020-02-19 DIAGNOSIS — C44619 Basal cell carcinoma of skin of left upper limb, including shoulder: Secondary | ICD-10-CM | POA: Diagnosis not present

## 2020-03-15 DIAGNOSIS — L409 Psoriasis, unspecified: Secondary | ICD-10-CM | POA: Diagnosis not present

## 2020-03-15 DIAGNOSIS — E669 Obesity, unspecified: Secondary | ICD-10-CM | POA: Diagnosis not present

## 2020-03-15 DIAGNOSIS — L600 Ingrowing nail: Secondary | ICD-10-CM | POA: Diagnosis not present

## 2020-03-15 DIAGNOSIS — Z79899 Other long term (current) drug therapy: Secondary | ICD-10-CM | POA: Diagnosis not present

## 2020-03-15 DIAGNOSIS — M1990 Unspecified osteoarthritis, unspecified site: Secondary | ICD-10-CM | POA: Diagnosis not present

## 2020-03-15 DIAGNOSIS — M0609 Rheumatoid arthritis without rheumatoid factor, multiple sites: Secondary | ICD-10-CM | POA: Diagnosis not present

## 2020-03-15 DIAGNOSIS — M7989 Other specified soft tissue disorders: Secondary | ICD-10-CM | POA: Diagnosis not present

## 2020-03-15 DIAGNOSIS — M170 Bilateral primary osteoarthritis of knee: Secondary | ICD-10-CM | POA: Diagnosis not present

## 2020-03-15 DIAGNOSIS — M5459 Other low back pain: Secondary | ICD-10-CM | POA: Diagnosis not present

## 2020-03-15 DIAGNOSIS — C4490 Unspecified malignant neoplasm of skin, unspecified: Secondary | ICD-10-CM | POA: Diagnosis not present

## 2020-04-20 DIAGNOSIS — M0609 Rheumatoid arthritis without rheumatoid factor, multiple sites: Secondary | ICD-10-CM | POA: Diagnosis not present

## 2020-05-18 DIAGNOSIS — M0609 Rheumatoid arthritis without rheumatoid factor, multiple sites: Secondary | ICD-10-CM | POA: Diagnosis not present

## 2020-06-24 DIAGNOSIS — M0609 Rheumatoid arthritis without rheumatoid factor, multiple sites: Secondary | ICD-10-CM | POA: Diagnosis not present

## 2020-06-30 DIAGNOSIS — E1142 Type 2 diabetes mellitus with diabetic polyneuropathy: Secondary | ICD-10-CM | POA: Diagnosis not present

## 2020-06-30 DIAGNOSIS — F5101 Primary insomnia: Secondary | ICD-10-CM | POA: Diagnosis not present

## 2020-06-30 DIAGNOSIS — E785 Hyperlipidemia, unspecified: Secondary | ICD-10-CM | POA: Diagnosis not present

## 2020-06-30 DIAGNOSIS — J309 Allergic rhinitis, unspecified: Secondary | ICD-10-CM | POA: Diagnosis not present

## 2020-06-30 DIAGNOSIS — I10 Essential (primary) hypertension: Secondary | ICD-10-CM | POA: Diagnosis not present

## 2020-06-30 DIAGNOSIS — E669 Obesity, unspecified: Secondary | ICD-10-CM | POA: Diagnosis not present

## 2020-06-30 DIAGNOSIS — M069 Rheumatoid arthritis, unspecified: Secondary | ICD-10-CM | POA: Diagnosis not present

## 2020-06-30 DIAGNOSIS — D3500 Benign neoplasm of unspecified adrenal gland: Secondary | ICD-10-CM | POA: Diagnosis not present

## 2020-06-30 DIAGNOSIS — M179 Osteoarthritis of knee, unspecified: Secondary | ICD-10-CM | POA: Diagnosis not present

## 2020-06-30 DIAGNOSIS — E559 Vitamin D deficiency, unspecified: Secondary | ICD-10-CM | POA: Diagnosis not present

## 2020-06-30 DIAGNOSIS — C541 Malignant neoplasm of endometrium: Secondary | ICD-10-CM | POA: Diagnosis not present

## 2020-06-30 DIAGNOSIS — I700 Atherosclerosis of aorta: Secondary | ICD-10-CM | POA: Diagnosis not present

## 2020-07-22 DIAGNOSIS — M0609 Rheumatoid arthritis without rheumatoid factor, multiple sites: Secondary | ICD-10-CM | POA: Diagnosis not present

## 2020-08-05 DIAGNOSIS — M170 Bilateral primary osteoarthritis of knee: Secondary | ICD-10-CM | POA: Diagnosis not present

## 2020-08-05 DIAGNOSIS — M0609 Rheumatoid arthritis without rheumatoid factor, multiple sites: Secondary | ICD-10-CM | POA: Diagnosis not present

## 2020-08-05 DIAGNOSIS — C4490 Unspecified malignant neoplasm of skin, unspecified: Secondary | ICD-10-CM | POA: Diagnosis not present

## 2020-08-05 DIAGNOSIS — E669 Obesity, unspecified: Secondary | ICD-10-CM | POA: Diagnosis not present

## 2020-08-05 DIAGNOSIS — L409 Psoriasis, unspecified: Secondary | ICD-10-CM | POA: Diagnosis not present

## 2020-08-05 DIAGNOSIS — M1990 Unspecified osteoarthritis, unspecified site: Secondary | ICD-10-CM | POA: Diagnosis not present

## 2020-08-05 DIAGNOSIS — M25521 Pain in right elbow: Secondary | ICD-10-CM | POA: Diagnosis not present

## 2020-08-05 DIAGNOSIS — Z79899 Other long term (current) drug therapy: Secondary | ICD-10-CM | POA: Diagnosis not present

## 2020-08-05 DIAGNOSIS — M5459 Other low back pain: Secondary | ICD-10-CM | POA: Diagnosis not present

## 2020-08-05 DIAGNOSIS — M7710 Lateral epicondylitis, unspecified elbow: Secondary | ICD-10-CM | POA: Diagnosis not present

## 2020-08-26 DIAGNOSIS — M0609 Rheumatoid arthritis without rheumatoid factor, multiple sites: Secondary | ICD-10-CM | POA: Diagnosis not present

## 2020-09-23 DIAGNOSIS — M0609 Rheumatoid arthritis without rheumatoid factor, multiple sites: Secondary | ICD-10-CM | POA: Diagnosis not present

## 2020-10-27 DIAGNOSIS — M0609 Rheumatoid arthritis without rheumatoid factor, multiple sites: Secondary | ICD-10-CM | POA: Diagnosis not present

## 2020-11-10 DIAGNOSIS — D3500 Benign neoplasm of unspecified adrenal gland: Secondary | ICD-10-CM | POA: Diagnosis not present

## 2020-11-10 DIAGNOSIS — M069 Rheumatoid arthritis, unspecified: Secondary | ICD-10-CM | POA: Diagnosis not present

## 2020-11-10 DIAGNOSIS — I700 Atherosclerosis of aorta: Secondary | ICD-10-CM | POA: Diagnosis not present

## 2020-11-10 DIAGNOSIS — I10 Essential (primary) hypertension: Secondary | ICD-10-CM | POA: Diagnosis not present

## 2020-11-10 DIAGNOSIS — F5101 Primary insomnia: Secondary | ICD-10-CM | POA: Diagnosis not present

## 2020-11-10 DIAGNOSIS — J309 Allergic rhinitis, unspecified: Secondary | ICD-10-CM | POA: Diagnosis not present

## 2020-11-10 DIAGNOSIS — E1142 Type 2 diabetes mellitus with diabetic polyneuropathy: Secondary | ICD-10-CM | POA: Diagnosis not present

## 2020-11-10 DIAGNOSIS — N200 Calculus of kidney: Secondary | ICD-10-CM | POA: Diagnosis not present

## 2020-11-10 DIAGNOSIS — E669 Obesity, unspecified: Secondary | ICD-10-CM | POA: Diagnosis not present

## 2020-11-10 DIAGNOSIS — M179 Osteoarthritis of knee, unspecified: Secondary | ICD-10-CM | POA: Diagnosis not present

## 2020-11-10 DIAGNOSIS — Z8542 Personal history of malignant neoplasm of other parts of uterus: Secondary | ICD-10-CM | POA: Diagnosis not present

## 2020-12-08 DIAGNOSIS — Z79899 Other long term (current) drug therapy: Secondary | ICD-10-CM | POA: Diagnosis not present

## 2020-12-08 DIAGNOSIS — M0609 Rheumatoid arthritis without rheumatoid factor, multiple sites: Secondary | ICD-10-CM | POA: Diagnosis not present

## 2020-12-08 DIAGNOSIS — M5459 Other low back pain: Secondary | ICD-10-CM | POA: Diagnosis not present

## 2020-12-08 DIAGNOSIS — C4490 Unspecified malignant neoplasm of skin, unspecified: Secondary | ICD-10-CM | POA: Diagnosis not present

## 2020-12-08 DIAGNOSIS — L409 Psoriasis, unspecified: Secondary | ICD-10-CM | POA: Diagnosis not present

## 2020-12-08 DIAGNOSIS — M1990 Unspecified osteoarthritis, unspecified site: Secondary | ICD-10-CM | POA: Diagnosis not present

## 2020-12-08 DIAGNOSIS — E669 Obesity, unspecified: Secondary | ICD-10-CM | POA: Diagnosis not present

## 2020-12-08 DIAGNOSIS — M170 Bilateral primary osteoarthritis of knee: Secondary | ICD-10-CM | POA: Diagnosis not present

## 2020-12-08 DIAGNOSIS — M7710 Lateral epicondylitis, unspecified elbow: Secondary | ICD-10-CM | POA: Diagnosis not present

## 2021-01-10 DIAGNOSIS — M0609 Rheumatoid arthritis without rheumatoid factor, multiple sites: Secondary | ICD-10-CM | POA: Diagnosis not present

## 2021-01-13 DIAGNOSIS — H16223 Keratoconjunctivitis sicca, not specified as Sjogren's, bilateral: Secondary | ICD-10-CM | POA: Diagnosis not present

## 2021-01-13 DIAGNOSIS — Z9842 Cataract extraction status, left eye: Secondary | ICD-10-CM | POA: Diagnosis not present

## 2021-01-13 DIAGNOSIS — E119 Type 2 diabetes mellitus without complications: Secondary | ICD-10-CM | POA: Diagnosis not present

## 2021-01-13 DIAGNOSIS — H40023 Open angle with borderline findings, high risk, bilateral: Secondary | ICD-10-CM | POA: Diagnosis not present

## 2021-01-13 DIAGNOSIS — H04123 Dry eye syndrome of bilateral lacrimal glands: Secondary | ICD-10-CM | POA: Diagnosis not present

## 2021-01-13 DIAGNOSIS — H5212 Myopia, left eye: Secondary | ICD-10-CM | POA: Diagnosis not present

## 2021-01-13 DIAGNOSIS — Z9841 Cataract extraction status, right eye: Secondary | ICD-10-CM | POA: Diagnosis not present

## 2021-02-24 DIAGNOSIS — M0609 Rheumatoid arthritis without rheumatoid factor, multiple sites: Secondary | ICD-10-CM | POA: Diagnosis not present

## 2021-03-10 DIAGNOSIS — E1142 Type 2 diabetes mellitus with diabetic polyneuropathy: Secondary | ICD-10-CM | POA: Diagnosis not present

## 2021-03-10 DIAGNOSIS — I10 Essential (primary) hypertension: Secondary | ICD-10-CM | POA: Diagnosis not present

## 2021-03-10 DIAGNOSIS — I700 Atherosclerosis of aorta: Secondary | ICD-10-CM | POA: Diagnosis not present

## 2021-03-10 DIAGNOSIS — E559 Vitamin D deficiency, unspecified: Secondary | ICD-10-CM | POA: Diagnosis not present

## 2021-03-10 DIAGNOSIS — F5101 Primary insomnia: Secondary | ICD-10-CM | POA: Diagnosis not present

## 2021-03-10 DIAGNOSIS — C541 Malignant neoplasm of endometrium: Secondary | ICD-10-CM | POA: Diagnosis not present

## 2021-03-10 DIAGNOSIS — M069 Rheumatoid arthritis, unspecified: Secondary | ICD-10-CM | POA: Diagnosis not present

## 2021-03-10 DIAGNOSIS — N200 Calculus of kidney: Secondary | ICD-10-CM | POA: Diagnosis not present

## 2021-03-10 DIAGNOSIS — E785 Hyperlipidemia, unspecified: Secondary | ICD-10-CM | POA: Diagnosis not present

## 2021-03-10 DIAGNOSIS — E669 Obesity, unspecified: Secondary | ICD-10-CM | POA: Diagnosis not present

## 2021-03-10 DIAGNOSIS — M179 Osteoarthritis of knee, unspecified: Secondary | ICD-10-CM | POA: Diagnosis not present

## 2021-03-10 DIAGNOSIS — D3500 Benign neoplasm of unspecified adrenal gland: Secondary | ICD-10-CM | POA: Diagnosis not present

## 2021-04-05 DIAGNOSIS — M0609 Rheumatoid arthritis without rheumatoid factor, multiple sites: Secondary | ICD-10-CM | POA: Diagnosis not present

## 2021-05-05 DIAGNOSIS — M0609 Rheumatoid arthritis without rheumatoid factor, multiple sites: Secondary | ICD-10-CM | POA: Diagnosis not present

## 2021-05-27 DIAGNOSIS — M0609 Rheumatoid arthritis without rheumatoid factor, multiple sites: Secondary | ICD-10-CM | POA: Diagnosis not present

## 2021-05-27 DIAGNOSIS — C4490 Unspecified malignant neoplasm of skin, unspecified: Secondary | ICD-10-CM | POA: Diagnosis not present

## 2021-05-27 DIAGNOSIS — M7989 Other specified soft tissue disorders: Secondary | ICD-10-CM | POA: Diagnosis not present

## 2021-05-27 DIAGNOSIS — M5459 Other low back pain: Secondary | ICD-10-CM | POA: Diagnosis not present

## 2021-05-27 DIAGNOSIS — E669 Obesity, unspecified: Secondary | ICD-10-CM | POA: Diagnosis not present

## 2021-05-27 DIAGNOSIS — R739 Hyperglycemia, unspecified: Secondary | ICD-10-CM | POA: Diagnosis not present

## 2021-05-27 DIAGNOSIS — Z79899 Other long term (current) drug therapy: Secondary | ICD-10-CM | POA: Diagnosis not present

## 2021-05-27 DIAGNOSIS — L409 Psoriasis, unspecified: Secondary | ICD-10-CM | POA: Diagnosis not present

## 2021-05-27 DIAGNOSIS — M1990 Unspecified osteoarthritis, unspecified site: Secondary | ICD-10-CM | POA: Diagnosis not present

## 2021-05-27 DIAGNOSIS — M79643 Pain in unspecified hand: Secondary | ICD-10-CM | POA: Diagnosis not present

## 2021-05-27 DIAGNOSIS — M170 Bilateral primary osteoarthritis of knee: Secondary | ICD-10-CM | POA: Diagnosis not present

## 2021-06-02 DIAGNOSIS — M0609 Rheumatoid arthritis without rheumatoid factor, multiple sites: Secondary | ICD-10-CM | POA: Diagnosis not present

## 2021-06-05 DIAGNOSIS — Z23 Encounter for immunization: Secondary | ICD-10-CM | POA: Diagnosis not present

## 2021-06-29 DIAGNOSIS — J309 Allergic rhinitis, unspecified: Secondary | ICD-10-CM | POA: Diagnosis not present

## 2021-06-29 DIAGNOSIS — M179 Osteoarthritis of knee, unspecified: Secondary | ICD-10-CM | POA: Diagnosis not present

## 2021-06-29 DIAGNOSIS — I10 Essential (primary) hypertension: Secondary | ICD-10-CM | POA: Diagnosis not present

## 2021-06-29 DIAGNOSIS — E669 Obesity, unspecified: Secondary | ICD-10-CM | POA: Diagnosis not present

## 2021-06-29 DIAGNOSIS — E785 Hyperlipidemia, unspecified: Secondary | ICD-10-CM | POA: Diagnosis not present

## 2021-06-29 DIAGNOSIS — M069 Rheumatoid arthritis, unspecified: Secondary | ICD-10-CM | POA: Diagnosis not present

## 2021-06-29 DIAGNOSIS — D3500 Benign neoplasm of unspecified adrenal gland: Secondary | ICD-10-CM | POA: Diagnosis not present

## 2021-06-29 DIAGNOSIS — Z1331 Encounter for screening for depression: Secondary | ICD-10-CM | POA: Diagnosis not present

## 2021-06-29 DIAGNOSIS — F5101 Primary insomnia: Secondary | ICD-10-CM | POA: Diagnosis not present

## 2021-06-29 DIAGNOSIS — E1142 Type 2 diabetes mellitus with diabetic polyneuropathy: Secondary | ICD-10-CM | POA: Diagnosis not present

## 2021-06-29 DIAGNOSIS — E559 Vitamin D deficiency, unspecified: Secondary | ICD-10-CM | POA: Diagnosis not present

## 2021-06-29 DIAGNOSIS — I700 Atherosclerosis of aorta: Secondary | ICD-10-CM | POA: Diagnosis not present

## 2021-07-11 DIAGNOSIS — Z1159 Encounter for screening for other viral diseases: Secondary | ICD-10-CM | POA: Diagnosis not present

## 2021-07-11 DIAGNOSIS — M0609 Rheumatoid arthritis without rheumatoid factor, multiple sites: Secondary | ICD-10-CM | POA: Diagnosis not present

## 2021-08-23 DIAGNOSIS — M0609 Rheumatoid arthritis without rheumatoid factor, multiple sites: Secondary | ICD-10-CM | POA: Diagnosis not present

## 2021-09-28 DIAGNOSIS — M170 Bilateral primary osteoarthritis of knee: Secondary | ICD-10-CM | POA: Diagnosis not present

## 2021-09-28 DIAGNOSIS — C4490 Unspecified malignant neoplasm of skin, unspecified: Secondary | ICD-10-CM | POA: Diagnosis not present

## 2021-09-28 DIAGNOSIS — M5459 Other low back pain: Secondary | ICD-10-CM | POA: Diagnosis not present

## 2021-09-28 DIAGNOSIS — M0609 Rheumatoid arthritis without rheumatoid factor, multiple sites: Secondary | ICD-10-CM | POA: Diagnosis not present

## 2021-09-28 DIAGNOSIS — Z79899 Other long term (current) drug therapy: Secondary | ICD-10-CM | POA: Diagnosis not present

## 2021-09-28 DIAGNOSIS — M79643 Pain in unspecified hand: Secondary | ICD-10-CM | POA: Diagnosis not present

## 2021-09-28 DIAGNOSIS — M1990 Unspecified osteoarthritis, unspecified site: Secondary | ICD-10-CM | POA: Diagnosis not present

## 2021-09-28 DIAGNOSIS — E669 Obesity, unspecified: Secondary | ICD-10-CM | POA: Diagnosis not present

## 2021-09-28 DIAGNOSIS — L409 Psoriasis, unspecified: Secondary | ICD-10-CM | POA: Diagnosis not present

## 2021-10-04 DIAGNOSIS — E785 Hyperlipidemia, unspecified: Secondary | ICD-10-CM | POA: Diagnosis not present

## 2021-10-04 DIAGNOSIS — I700 Atherosclerosis of aorta: Secondary | ICD-10-CM | POA: Diagnosis not present

## 2021-10-04 DIAGNOSIS — N200 Calculus of kidney: Secondary | ICD-10-CM | POA: Diagnosis not present

## 2021-10-04 DIAGNOSIS — F5101 Primary insomnia: Secondary | ICD-10-CM | POA: Diagnosis not present

## 2021-10-04 DIAGNOSIS — I10 Essential (primary) hypertension: Secondary | ICD-10-CM | POA: Diagnosis not present

## 2021-10-04 DIAGNOSIS — E669 Obesity, unspecified: Secondary | ICD-10-CM | POA: Diagnosis not present

## 2021-10-04 DIAGNOSIS — E1142 Type 2 diabetes mellitus with diabetic polyneuropathy: Secondary | ICD-10-CM | POA: Diagnosis not present

## 2021-10-04 DIAGNOSIS — J309 Allergic rhinitis, unspecified: Secondary | ICD-10-CM | POA: Diagnosis not present

## 2021-10-04 DIAGNOSIS — M179 Osteoarthritis of knee, unspecified: Secondary | ICD-10-CM | POA: Diagnosis not present

## 2021-10-04 DIAGNOSIS — D3500 Benign neoplasm of unspecified adrenal gland: Secondary | ICD-10-CM | POA: Diagnosis not present

## 2021-10-04 DIAGNOSIS — E559 Vitamin D deficiency, unspecified: Secondary | ICD-10-CM | POA: Diagnosis not present

## 2021-10-04 DIAGNOSIS — M069 Rheumatoid arthritis, unspecified: Secondary | ICD-10-CM | POA: Diagnosis not present

## 2021-10-15 DIAGNOSIS — L02416 Cutaneous abscess of left lower limb: Secondary | ICD-10-CM | POA: Diagnosis not present

## 2021-11-07 DIAGNOSIS — M0609 Rheumatoid arthritis without rheumatoid factor, multiple sites: Secondary | ICD-10-CM | POA: Diagnosis not present

## 2021-12-05 DIAGNOSIS — M0609 Rheumatoid arthritis without rheumatoid factor, multiple sites: Secondary | ICD-10-CM | POA: Diagnosis not present

## 2021-12-29 DIAGNOSIS — M170 Bilateral primary osteoarthritis of knee: Secondary | ICD-10-CM | POA: Diagnosis not present

## 2021-12-29 DIAGNOSIS — L409 Psoriasis, unspecified: Secondary | ICD-10-CM | POA: Diagnosis not present

## 2021-12-29 DIAGNOSIS — E669 Obesity, unspecified: Secondary | ICD-10-CM | POA: Diagnosis not present

## 2021-12-29 DIAGNOSIS — M0609 Rheumatoid arthritis without rheumatoid factor, multiple sites: Secondary | ICD-10-CM | POA: Diagnosis not present

## 2021-12-29 DIAGNOSIS — Z79899 Other long term (current) drug therapy: Secondary | ICD-10-CM | POA: Diagnosis not present

## 2021-12-29 DIAGNOSIS — M1990 Unspecified osteoarthritis, unspecified site: Secondary | ICD-10-CM | POA: Diagnosis not present

## 2021-12-29 DIAGNOSIS — M5459 Other low back pain: Secondary | ICD-10-CM | POA: Diagnosis not present

## 2021-12-29 DIAGNOSIS — C4490 Unspecified malignant neoplasm of skin, unspecified: Secondary | ICD-10-CM | POA: Diagnosis not present

## 2021-12-29 DIAGNOSIS — M25562 Pain in left knee: Secondary | ICD-10-CM | POA: Diagnosis not present

## 2021-12-29 DIAGNOSIS — M79643 Pain in unspecified hand: Secondary | ICD-10-CM | POA: Diagnosis not present

## 2022-01-09 DIAGNOSIS — M0609 Rheumatoid arthritis without rheumatoid factor, multiple sites: Secondary | ICD-10-CM | POA: Diagnosis not present

## 2022-01-19 DIAGNOSIS — H5212 Myopia, left eye: Secondary | ICD-10-CM | POA: Diagnosis not present

## 2022-01-19 DIAGNOSIS — H04123 Dry eye syndrome of bilateral lacrimal glands: Secondary | ICD-10-CM | POA: Diagnosis not present

## 2022-01-19 DIAGNOSIS — Z9841 Cataract extraction status, right eye: Secondary | ICD-10-CM | POA: Diagnosis not present

## 2022-01-19 DIAGNOSIS — H40023 Open angle with borderline findings, high risk, bilateral: Secondary | ICD-10-CM | POA: Diagnosis not present

## 2022-01-19 DIAGNOSIS — H16223 Keratoconjunctivitis sicca, not specified as Sjogren's, bilateral: Secondary | ICD-10-CM | POA: Diagnosis not present

## 2022-01-19 DIAGNOSIS — Z9842 Cataract extraction status, left eye: Secondary | ICD-10-CM | POA: Diagnosis not present

## 2022-01-19 DIAGNOSIS — E119 Type 2 diabetes mellitus without complications: Secondary | ICD-10-CM | POA: Diagnosis not present

## 2022-01-20 DIAGNOSIS — Z96651 Presence of right artificial knee joint: Secondary | ICD-10-CM | POA: Diagnosis not present

## 2022-01-20 DIAGNOSIS — G8929 Other chronic pain: Secondary | ICD-10-CM | POA: Diagnosis not present

## 2022-01-20 DIAGNOSIS — E1142 Type 2 diabetes mellitus with diabetic polyneuropathy: Secondary | ICD-10-CM | POA: Diagnosis not present

## 2022-01-20 DIAGNOSIS — M5441 Lumbago with sciatica, right side: Secondary | ICD-10-CM | POA: Diagnosis not present

## 2022-01-20 DIAGNOSIS — M4726 Other spondylosis with radiculopathy, lumbar region: Secondary | ICD-10-CM | POA: Diagnosis not present

## 2022-01-20 DIAGNOSIS — M25561 Pain in right knee: Secondary | ICD-10-CM | POA: Diagnosis not present

## 2022-01-20 DIAGNOSIS — I700 Atherosclerosis of aorta: Secondary | ICD-10-CM | POA: Diagnosis not present

## 2022-02-06 DIAGNOSIS — M0609 Rheumatoid arthritis without rheumatoid factor, multiple sites: Secondary | ICD-10-CM | POA: Diagnosis not present

## 2022-02-20 DIAGNOSIS — E785 Hyperlipidemia, unspecified: Secondary | ICD-10-CM | POA: Diagnosis not present

## 2022-02-20 DIAGNOSIS — I700 Atherosclerosis of aorta: Secondary | ICD-10-CM | POA: Diagnosis not present

## 2022-02-20 DIAGNOSIS — M069 Rheumatoid arthritis, unspecified: Secondary | ICD-10-CM | POA: Diagnosis not present

## 2022-02-20 DIAGNOSIS — E559 Vitamin D deficiency, unspecified: Secondary | ICD-10-CM | POA: Diagnosis not present

## 2022-02-20 DIAGNOSIS — I10 Essential (primary) hypertension: Secondary | ICD-10-CM | POA: Diagnosis not present

## 2022-02-20 DIAGNOSIS — M179 Osteoarthritis of knee, unspecified: Secondary | ICD-10-CM | POA: Diagnosis not present

## 2022-02-20 DIAGNOSIS — N200 Calculus of kidney: Secondary | ICD-10-CM | POA: Diagnosis not present

## 2022-02-20 DIAGNOSIS — D3500 Benign neoplasm of unspecified adrenal gland: Secondary | ICD-10-CM | POA: Diagnosis not present

## 2022-02-20 DIAGNOSIS — E1142 Type 2 diabetes mellitus with diabetic polyneuropathy: Secondary | ICD-10-CM | POA: Diagnosis not present

## 2022-02-20 DIAGNOSIS — E669 Obesity, unspecified: Secondary | ICD-10-CM | POA: Diagnosis not present

## 2022-02-20 DIAGNOSIS — F5101 Primary insomnia: Secondary | ICD-10-CM | POA: Diagnosis not present

## 2022-03-13 DIAGNOSIS — M0609 Rheumatoid arthritis without rheumatoid factor, multiple sites: Secondary | ICD-10-CM | POA: Diagnosis not present

## 2022-04-12 DIAGNOSIS — M0609 Rheumatoid arthritis without rheumatoid factor, multiple sites: Secondary | ICD-10-CM | POA: Diagnosis not present

## 2022-05-10 DIAGNOSIS — M0609 Rheumatoid arthritis without rheumatoid factor, multiple sites: Secondary | ICD-10-CM | POA: Diagnosis not present

## 2023-04-10 ENCOUNTER — Other Ambulatory Visit (HOSPITAL_COMMUNITY): Payer: Self-pay | Admitting: Endocrinology

## 2023-04-10 DIAGNOSIS — I5032 Chronic diastolic (congestive) heart failure: Secondary | ICD-10-CM

## 2023-04-16 ENCOUNTER — Ambulatory Visit (HOSPITAL_COMMUNITY): Payer: Medicare Other | Attending: Cardiology

## 2023-04-16 DIAGNOSIS — I5032 Chronic diastolic (congestive) heart failure: Secondary | ICD-10-CM | POA: Diagnosis not present

## 2023-04-16 LAB — ECHOCARDIOGRAM COMPLETE
Area-P 1/2: 6.17 cm2
S' Lateral: 4 cm

## 2023-04-30 ENCOUNTER — Telehealth (HOSPITAL_COMMUNITY): Payer: Self-pay

## 2023-04-30 NOTE — Telephone Encounter (Signed)
Received referral from an office and called patient to set up a new patient appointment, any provider (MD) per Meredith Staggers, RN.   Called patient at [phone] ; voice mail box is not set up and was not able to reach patient.   Called patient at [phone] and left message for patient to call front office back to schedule a new patient appointment with any provider here.

## 2023-05-01 ENCOUNTER — Telehealth (HOSPITAL_COMMUNITY): Payer: Self-pay | Admitting: Cardiology

## 2023-05-22 ENCOUNTER — Other Ambulatory Visit: Payer: Self-pay

## 2023-05-22 ENCOUNTER — Telehealth (HOSPITAL_COMMUNITY): Payer: Self-pay | Admitting: Pharmacy Technician

## 2023-05-22 ENCOUNTER — Other Ambulatory Visit (HOSPITAL_COMMUNITY): Payer: Self-pay

## 2023-05-22 ENCOUNTER — Ambulatory Visit: Payer: Medicare Other | Attending: Cardiology | Admitting: Cardiology

## 2023-05-22 VITALS — BP 111/60 | HR 102 | Ht 64.5 in | Wt 162.0 lb

## 2023-05-22 DIAGNOSIS — E785 Hyperlipidemia, unspecified: Secondary | ICD-10-CM | POA: Diagnosis not present

## 2023-05-22 DIAGNOSIS — E119 Type 2 diabetes mellitus without complications: Secondary | ICD-10-CM | POA: Diagnosis not present

## 2023-05-22 DIAGNOSIS — Z8542 Personal history of malignant neoplasm of other parts of uterus: Secondary | ICD-10-CM | POA: Diagnosis not present

## 2023-05-22 DIAGNOSIS — I5022 Chronic systolic (congestive) heart failure: Secondary | ICD-10-CM

## 2023-05-22 DIAGNOSIS — I110 Hypertensive heart disease with heart failure: Secondary | ICD-10-CM | POA: Diagnosis present

## 2023-05-22 DIAGNOSIS — M069 Rheumatoid arthritis, unspecified: Secondary | ICD-10-CM | POA: Insufficient documentation

## 2023-05-22 DIAGNOSIS — Z7984 Long term (current) use of oral hypoglycemic drugs: Secondary | ICD-10-CM | POA: Insufficient documentation

## 2023-05-22 DIAGNOSIS — Z79899 Other long term (current) drug therapy: Secondary | ICD-10-CM | POA: Insufficient documentation

## 2023-05-22 MED ORDER — ENTRESTO 24-26 MG PO TABS: 1.0000 | ORAL_TABLET | Freq: Two times a day (BID) | ORAL | 11 refills | Status: DC

## 2023-05-22 MED ORDER — FARXIGA 10 MG PO TABS: 10.0000 mg | ORAL_TABLET | Freq: Every day | ORAL | 3 refills | Status: DC

## 2023-05-22 NOTE — H&P (View-Only) (Signed)
PCP: Adrian Prince, MD HF Cardiology: Dr. Shirlee Latch  78 y.o. with history of DM2, rheumatoid arthritis, pheochromocytoma s/p right adrenalectomy was referred by Dr. Evlyn Kanner for evaluation of CHF.  No prior cardiac history.  For several months, she has noted exertional dyspnea, and for 2-3 months she has had lower leg swelling.  She was started on Lasix several weeks ago by Dr. Evlyn Kanner and thinks that she has lost about 10 lbs.  She is also limited chronically by joint pain from rheumatoid arthritis. She is unable to get around stores or anywhere outside her house now without a wheelchair due to combination of dyspnea and joint pain.  She is short of breath showering and dressing.  She uses a walker inside the house but does not get particularly short of breath just walking around the house.  She has orthopnea, cannot lie flat in bed at night. No chest pain.   Echo was done in 12/24, showing EF 25-30%, moderate RV dysfunction, mild RV enlargement.   ECG (personally reviewed): NSR, RBBB, LAFB  Labs (5/24): K 4, creatinine 0.7, LDL 77 Labs (10/24): TSH normal, hgb 14.5  PMH: 1. Type 2 diabetes 2. Hyperlipidemia 3. 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  5. Endometrial cancer: 2010.  6. Chronic systolic CHF: Echo (12/24) with EF 25-30%, moderate RV dysfunction, mild RV enlargement.   FH: Brother with "heart problems."  SH: Originally from [place], lives in [place]. Widow.  No smoking or ETOH.   ROS: All systems reviewed and negative except as per HPI.   Current Outpatient Medications  Medication Sig Dispense Refill   Abatacept (ORENCIA) 125 MG/ML SOSY Inject 125 mg into the skin every 30 (thirty) days.     atorvastatin (LIPITOR) 40 MG tablet Take 1 tablet by mouth daily.     CONTOUR NEXT TEST test strip USE TO SELF MONITOR BLOOD GLUCOSE DAILY DX E11.9     cycloSPORINE (RESTASIS) 0.05 % ophthalmic emulsion Place 1 drop into both eyes 2  (two) times daily.      diphenhydramine-acetaminophen (TYLENOL PM) 25-500 MG TABS tablet Take 1 tablet by mouth at bedtime.     ergocalciferol (VITAMIN D2) 50000 units capsule Take 50,000 Units by mouth once a week.     FARXIGA 10 MG TABS tablet Take 1 tablet (10 mg total) by mouth daily before breakfast. 90 tablet 3   furosemide (LASIX) 40 MG tablet Take 0.5 tablets by mouth daily.     leflunomide (ARAVA) 10 MG tablet Take 10 mg by mouth daily.     metFORMIN (GLUCOPHAGE-XR) 500 MG 24 hr tablet Take 1 tablet by mouth 2 (two) times daily.     sacubitril-valsartan (ENTRESTO) 24-26 MG Take 1 tablet by mouth 2 (two) times daily. 60 tablet 11   clobetasol ointment (TEMOVATE) 0.05 % Apply 1 application topically 2 (two) times daily. (Patient not taking: Reported on 05/22/2023)     NEOMYCIN-POLYMYXIN-HYDROCORTISONE (CORTISPORIN) 1 % SOLN OTIC solution Apply 1-2 drops to toe BID after soaking (Patient not taking: Reported on 05/22/2023) 10 mL 1   triamcinolone ointment (KENALOG) 0.1 % Apply 1 application topically 2 (two) times daily. (Patient not taking: Reported on 05/22/2023)     No current facility-administered medications for this visit.   BP 111/60   Pulse (!) 102   Ht 5' 4.5" (1.638 m)   Wt 162 lb (73.5 kg)   SpO2 93%   BMI 27.38 kg/m  General: NAD Neck: JVP 8 cm, no thyromegaly or  thyroid nodule.  Lungs: Clear to auscultation bilaterally with normal respiratory effort. CV: Nondisplaced PMI.  Heart regular S1/S2, no S3/S4, no murmur.  Trace ankle edema.  No carotid bruit.  Normal pedal pulses.  Abdomen: Soft, nontender, no hepatosplenomegaly, no distention.  Skin: Intact without lesions or rashes.  Neurologic: Alert and oriented x 3.  Psych: Normal affect. Extremities: No clubbing or cyanosis.  HEENT: Normal.   Assessment/Plan: 1. Chronic systolic CHF: Echo in 12/24 showed EF 25-30%, moderate RV dysfunction, mild RV enlargement.  Patient has had exertional dyspnea and peripheral edema for  several months now.  No chest pain. Cause of cardiomyopathy is uncertain.  She does not have a family history of heart failure and does not use drugs or drink heavily.  She has a history of HTN but BP has been controlled. No history of atrial fibrillation or arrhythmia.  On exam, she is mildly volume overloaded.  NYHA class 3 symptoms.   - Continue Lasix 20 mg daily.  - I will add Farxiga 10 mg daily, this will add some additional diuresis. - Stop lisinopril, in 36 hrs she will start Entresto 24/26 bid. BMET/BNP today, BMET in 10 days.  - I will arrange for LHC/RHC to assess filling pressures, CO and to rule out CAD as a cause of her cardiomyopathy. We discussed risks/benefits and she agrees to procedure.  - If there is no significant CAD on cath, I will arrange for cardiac MRI.  2. HTN: BP is controlled.  3. H/o right adrenal pheochromocytoma: S/p right adrenalectomy.  4. Hyperlipidemia: Continue atorvastatin.  5. Rheumatoid arthritis: This limits her ambulation due to joint pain.   Followup in 2 wks with HF pharmacist, see me in 1 month.   Marca Ancona 05/22/2023

## 2023-05-22 NOTE — Patient Instructions (Signed)
 STOP Lisinopril  START Entresto  24/26 mg Twice daily on Thursday Morning.  START Farxiga  10 mg daily.  Go DOWN to LOWER LEVEL (LL) to have your blood work completed inside of [employer] office.  Go over to the [facility]ICAL [facility]ALL. Go pass the gift shop and have your blood work completed in 10 days  We will only call you if the results are abnormal or if the provider would like to make medication changes.   You are scheduled for a Cardiac Catheterization on Thursday, January 23 with Dr. Ezra Shuck.  1. Please arrive at the [facility] (Main Entrance A) at [facility]: [address] [place], [place] [address] at 5:30 AM (This time is 2 hour(s) before your procedure to ensure your preparation).   Free valet parking service is available. You will check in at ADMITTING. The support person will be asked to wait in the waiting room.  It is OK to have someone drop you off and come back when you are ready to be discharged.    Special note: Every effort is made to have your procedure done on time. Please understand that emergencies sometimes delay scheduled procedures.  2. Diet: Do not eat solid foods after midnight.  The patient may have clear liquids until 5am upon the day of the procedure.  3. Medication instructions in preparation for your procedure:   Contrast Allergy: No  HOLD FARXIGA  AND LASIX  THE MORNING OF YOUR PROCEDURE.  Do not take Diabetes Med Glucophage (Metformin) on the day of the procedure and HOLD 48 HOURS AFTER THE PROCEDURE.  On the morning of your procedure, take any morning medicines NOT listed above.  You may use sips of water.  5. Plan to go home the same day, you will only stay overnight if medically necessary. 6. Bring a current list of your medications and current insurance cards. 7. You MUST have a responsible person to drive you home. 8. Someone MUST be with you the first 24 hours after you arrive home or your discharge will be delayed. 9. Please wear  clothes that are easy to get on and off and wear slip-on shoes.   Please follow up with our heart failure pharmacist in 3 weeks.  Your physician recommends that you schedule a follow-up appointment in: 1 month.

## 2023-05-22 NOTE — Telephone Encounter (Signed)
 Advanced Heart Failure Patient Advocate Encounter  The patient was approved for a [employer] grant that will help cover the cost of Entresto , Farxiga . Total amount awarded, $10,000. Eligibility, 04/22/23 - 04/20/24.  ID [id number]  BIN [id number]  PCN [id number]  Group [id number]  Patient was given copy of information while in clinic.  Almarie JULIANNA Pa, CPhT

## 2023-05-22 NOTE — Progress Notes (Signed)
 PCP: Adrian Prince, MD HF Cardiology: Dr. Shirlee Latch  78 y.o. with history of DM2, rheumatoid arthritis, pheochromocytoma s/p right adrenalectomy was referred by Dr. Evlyn Kanner for evaluation of CHF.  No prior cardiac history.  For several months, she has noted exertional dyspnea, and for 2-3 months she has had lower leg swelling.  She was started on Lasix several weeks ago by Dr. Evlyn Kanner and thinks that she has lost about 10 lbs.  She is also limited chronically by joint pain from rheumatoid arthritis. She is unable to get around stores or anywhere outside her house now without a wheelchair due to combination of dyspnea and joint pain.  She is short of breath showering and dressing.  She uses a walker inside the house but does not get particularly short of breath just walking around the house.  She has orthopnea, cannot lie flat in bed at night. No chest pain.   Echo was done in 12/24, showing EF 25-30%, moderate RV dysfunction, mild RV enlargement.   ECG (personally reviewed): NSR, RBBB, LAFB  Labs (5/24): K 4, creatinine 0.7, LDL 77 Labs (10/24): TSH normal, hgb 14.5  PMH: 1. Type 2 diabetes 2. Hyperlipidemia 3. 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  5. Endometrial cancer: 2010.  6. Chronic systolic CHF: Echo (12/24) with EF 25-30%, moderate RV dysfunction, mild RV enlargement.   FH: Brother with "heart problems."  SH: Originally from [place], lives in [place]. Widow.  No smoking or ETOH.   ROS: All systems reviewed and negative except as per HPI.   Current Outpatient Medications  Medication Sig Dispense Refill   Abatacept (ORENCIA) 125 MG/ML SOSY Inject 125 mg into the skin every 30 (thirty) days.     atorvastatin (LIPITOR) 40 MG tablet Take 1 tablet by mouth daily.     CONTOUR NEXT TEST test strip USE TO SELF MONITOR BLOOD GLUCOSE DAILY DX E11.9     cycloSPORINE (RESTASIS) 0.05 % ophthalmic emulsion Place 1 drop into both eyes 2  (two) times daily.      diphenhydramine-acetaminophen (TYLENOL PM) 25-500 MG TABS tablet Take 1 tablet by mouth at bedtime.     ergocalciferol (VITAMIN D2) 50000 units capsule Take 50,000 Units by mouth once a week.     FARXIGA 10 MG TABS tablet Take 1 tablet (10 mg total) by mouth daily before breakfast. 90 tablet 3   furosemide (LASIX) 40 MG tablet Take 0.5 tablets by mouth daily.     leflunomide (ARAVA) 10 MG tablet Take 10 mg by mouth daily.     metFORMIN (GLUCOPHAGE-XR) 500 MG 24 hr tablet Take 1 tablet by mouth 2 (two) times daily.     sacubitril-valsartan (ENTRESTO) 24-26 MG Take 1 tablet by mouth 2 (two) times daily. 60 tablet 11   clobetasol ointment (TEMOVATE) 0.05 % Apply 1 application topically 2 (two) times daily. (Patient not taking: Reported on 05/22/2023)     NEOMYCIN-POLYMYXIN-HYDROCORTISONE (CORTISPORIN) 1 % SOLN OTIC solution Apply 1-2 drops to toe BID after soaking (Patient not taking: Reported on 05/22/2023) 10 mL 1   triamcinolone ointment (KENALOG) 0.1 % Apply 1 application topically 2 (two) times daily. (Patient not taking: Reported on 05/22/2023)     No current facility-administered medications for this visit.   BP 111/60   Pulse (!) 102   Ht 5' 4.5" (1.638 m)   Wt 162 lb (73.5 kg)   SpO2 93%   BMI 27.38 kg/m  General: NAD Neck: JVP 8 cm, no thyromegaly or  thyroid nodule.  Lungs: Clear to auscultation bilaterally with normal respiratory effort. CV: Nondisplaced PMI.  Heart regular S1/S2, no S3/S4, no murmur.  Trace ankle edema.  No carotid bruit.  Normal pedal pulses.  Abdomen: Soft, nontender, no hepatosplenomegaly, no distention.  Skin: Intact without lesions or rashes.  Neurologic: Alert and oriented x 3.  Psych: Normal affect. Extremities: No clubbing or cyanosis.  HEENT: Normal.   Assessment/Plan: 1. Chronic systolic CHF: Echo in 12/24 showed EF 25-30%, moderate RV dysfunction, mild RV enlargement.  Patient has had exertional dyspnea and peripheral edema for  several months now.  No chest pain. Cause of cardiomyopathy is uncertain.  She does not have a family history of heart failure and does not use drugs or drink heavily.  She has a history of HTN but BP has been controlled. No history of atrial fibrillation or arrhythmia.  On exam, she is mildly volume overloaded.  NYHA class 3 symptoms.   - Continue Lasix 20 mg daily.  - I will add Farxiga 10 mg daily, this will add some additional diuresis. - Stop lisinopril, in 36 hrs she will start Entresto 24/26 bid. BMET/BNP today, BMET in 10 days.  - I will arrange for LHC/RHC to assess filling pressures, CO and to rule out CAD as a cause of her cardiomyopathy. We discussed risks/benefits and she agrees to procedure.  - If there is no significant CAD on cath, I will arrange for cardiac MRI.  2. HTN: BP is controlled.  3. H/o right adrenal pheochromocytoma: S/p right adrenalectomy.  4. Hyperlipidemia: Continue atorvastatin.  5. Rheumatoid arthritis: This limits her ambulation due to joint pain.   Followup in 2 wks with HF pharmacist, see me in 1 month.   Tammy Ashley 05/22/2023

## 2023-05-23 LAB — BRAIN NATRIURETIC PEPTIDE: BNP: 462.3 pg/mL — ABNORMAL HIGH (ref 0.0–100.0)

## 2023-05-23 LAB — BASIC METABOLIC PANEL
BUN/Creatinine Ratio: 37 — ABNORMAL HIGH (ref 12–28)
BUN: 21 mg/dL (ref 8–27)
CO2: 26 mmol/L (ref 20–29)
Calcium: 9.8 mg/dL (ref 8.7–10.3)
Chloride: 98 mmol/L (ref 96–106)
Creatinine, Ser: 0.57 mg/dL (ref 0.57–1.00)
Glucose: 175 mg/dL — ABNORMAL HIGH (ref 70–99)
Potassium: 4.6 mmol/L (ref 3.5–5.2)
Sodium: 140 mmol/L (ref 134–144)
eGFR: 94 mL/min/{1.73_m2} (ref >59)

## 2023-05-25 ENCOUNTER — Telehealth (HOSPITAL_COMMUNITY): Payer: Self-pay | Admitting: Licensed Clinical Social Worker

## 2023-05-25 NOTE — Telephone Encounter (Signed)
 H&V Care Navigation CSW Progress Note  Clinical Social Worker consulted by pharmacy team to assist pt in applying for LIS/Extra Help.  CSW called and assisted pt in applying- anticipate she will be denied as she is over resource limit.  Pt will get determination in approximately 1 month.   SDOH Screenings   Financial Resource Strain: Medium Risk (05/25/2023)  Tobacco Use: Low Risk  (01/20/2022)   Received from [facility], [facility]   Tammy Ashley, [place] Clinical Social Worker Advanced Heart Failure Clinic Desk#: [phone] Cell#: [phone]

## 2023-06-01 ENCOUNTER — Other Ambulatory Visit
Admission: RE | Admit: 2023-06-01 | Discharge: 2023-06-01 | Disposition: A | Discharge status: Home / Self Care | Payer: Medicare Other | Attending: Cardiology | Admitting: Cardiology

## 2023-06-01 DIAGNOSIS — I5022 Chronic systolic (congestive) heart failure: Secondary | ICD-10-CM | POA: Diagnosis present

## 2023-06-01 LAB — BASIC METABOLIC PANEL
Anion gap: 10 (ref 5–15)
BUN: 23 mg/dL (ref 8–23)
CO2: 28 mmol/L (ref 22–32)
Calcium: 9 mg/dL (ref 8.9–10.3)
Chloride: 99 mmol/L (ref 98–111)
Creatinine, Ser: 0.54 mg/dL (ref 0.44–1.00)
GFR, Estimated: >60 mL/min (ref >60)
Glucose, Bld: 152 mg/dL — ABNORMAL HIGH (ref 70–99)
Potassium: 3.7 mmol/L (ref 3.5–5.1)
Sodium: 137 mmol/L (ref 135–145)

## 2023-06-02 ENCOUNTER — Other Ambulatory Visit: Payer: Self-pay | Admitting: Physician Assistant

## 2023-06-02 ENCOUNTER — Telehealth: Payer: Self-pay | Admitting: Physician Assistant

## 2023-06-02 MED ORDER — ZOFRAN 4 MG PO TABS: 4.0000 mg | ORAL_TABLET | Freq: Two times a day (BID) | ORAL | 0 refills | Status: AC | PRN

## 2023-06-02 NOTE — Telephone Encounter (Signed)
Ms. Mandelbaum's daughter called.  She has been having problems with nausea and vomiting today.  She gets this periodically, she will have problems with nausea and vomiting for a couple of days, then symptoms will resolve spontaneously.  She has not been evaluated by her MD for this.  She has some right abdominal pain with the nausea and vomiting, and still has her gallbladder.  I called in 10 Zofran tablets for her, to help with the symptoms.  Her daughter was worried that it was because of her medications.  I explained that if you take medications every day and you only have a couple of days of nausea and vomiting once in a while, I do not feel it is the daily medications.  I strongly encouraged her to contact her PCP on Monday to see about getting the this evaluated.  One of the possible causes for this is gallbladder colic, I mentioned this to her daughter.  Theodore Demark, PA-C 06/02/2023 5:17 PM

## 2023-06-06 ENCOUNTER — Encounter (HOSPITAL_COMMUNITY): Payer: Medicare Other | Admitting: Cardiology

## 2023-06-07 ENCOUNTER — Other Ambulatory Visit: Payer: Self-pay

## 2023-06-07 ENCOUNTER — Ambulatory Visit (HOSPITAL_COMMUNITY): Payer: Medicare Other

## 2023-06-07 ENCOUNTER — Ambulatory Visit (HOSPITAL_COMMUNITY)
Admission: RE | Admit: 2023-06-07 | Discharge: 2023-06-07 | Disposition: A | Discharge status: Home / Self Care | Payer: Medicare Other | Attending: Cardiology | Admitting: Cardiology

## 2023-06-07 ENCOUNTER — Encounter (HOSPITAL_COMMUNITY): Payer: Self-pay | Admitting: Cardiology

## 2023-06-07 ENCOUNTER — Encounter (HOSPITAL_COMMUNITY)
Admission: RE | Disposition: A | Discharge status: Home / Self Care | Payer: Self-pay | Source: Home / Self Care | Attending: Cardiology

## 2023-06-07 ENCOUNTER — Other Ambulatory Visit: Payer: Self-pay | Admitting: Cardiology

## 2023-06-07 DIAGNOSIS — I2582 Chronic total occlusion of coronary artery: Secondary | ICD-10-CM | POA: Diagnosis not present

## 2023-06-07 DIAGNOSIS — I2721 Secondary pulmonary arterial hypertension: Secondary | ICD-10-CM | POA: Insufficient documentation

## 2023-06-07 DIAGNOSIS — I5022 Chronic systolic (congestive) heart failure: Secondary | ICD-10-CM | POA: Diagnosis not present

## 2023-06-07 DIAGNOSIS — Z7984 Long term (current) use of oral hypoglycemic drugs: Secondary | ICD-10-CM | POA: Insufficient documentation

## 2023-06-07 DIAGNOSIS — E785 Hyperlipidemia, unspecified: Secondary | ICD-10-CM | POA: Diagnosis not present

## 2023-06-07 DIAGNOSIS — M069 Rheumatoid arthritis, unspecified: Secondary | ICD-10-CM | POA: Diagnosis not present

## 2023-06-07 DIAGNOSIS — I2510 Atherosclerotic heart disease of native coronary artery without angina pectoris: Secondary | ICD-10-CM | POA: Diagnosis not present

## 2023-06-07 DIAGNOSIS — I429 Cardiomyopathy, unspecified: Secondary | ICD-10-CM | POA: Diagnosis not present

## 2023-06-07 DIAGNOSIS — I509 Heart failure, unspecified: Secondary | ICD-10-CM

## 2023-06-07 DIAGNOSIS — I110 Hypertensive heart disease with heart failure: Secondary | ICD-10-CM | POA: Insufficient documentation

## 2023-06-07 DIAGNOSIS — G43109 Migraine with aura, not intractable, without status migrainosus: Secondary | ICD-10-CM | POA: Diagnosis not present

## 2023-06-07 DIAGNOSIS — E119 Type 2 diabetes mellitus without complications: Secondary | ICD-10-CM | POA: Insufficient documentation

## 2023-06-07 DIAGNOSIS — G4733 Obstructive sleep apnea (adult) (pediatric): Secondary | ICD-10-CM | POA: Insufficient documentation

## 2023-06-07 DIAGNOSIS — Z79899 Other long term (current) drug therapy: Secondary | ICD-10-CM | POA: Diagnosis not present

## 2023-06-07 DIAGNOSIS — Z01810 Encounter for preprocedural cardiovascular examination: Secondary | ICD-10-CM | POA: Diagnosis not present

## 2023-06-07 DIAGNOSIS — Z8542 Personal history of malignant neoplasm of other parts of uterus: Secondary | ICD-10-CM | POA: Insufficient documentation

## 2023-06-07 HISTORY — PX: RIGHT/LEFT HEART CATH AND CORONARY ANGIOGRAPHY: CATH118266

## 2023-06-07 LAB — CBC
HCT: 45.9 % (ref 36.0–46.0)
Hemoglobin: 15 g/dL (ref 12.0–15.0)
MCH: 28.3 pg (ref 26.0–34.0)
MCHC: 32.7 g/dL (ref 30.0–36.0)
MCV: 86.6 fL (ref 80.0–100.0)
Platelets: 344 10*3/uL (ref 150–400)
RBC: 5.3 MIL/uL — ABNORMAL HIGH (ref 3.87–5.11)
RDW: 14.2 % (ref 11.5–15.5)
WBC: 5.5 10*3/uL (ref 4.0–10.5)
nRBC: 0 % (ref 0.0–0.2)

## 2023-06-07 LAB — GLUCOSE, CAPILLARY
Glucose-Capillary: 159 mg/dL — ABNORMAL HIGH (ref 70–99)
Glucose-Capillary: 139 mg/dL — ABNORMAL HIGH (ref 70–99)

## 2023-06-07 SURGERY — RIGHT/LEFT HEART CATH AND CORONARY ANGIOGRAPHY
Anesthesia: LOCAL

## 2023-06-07 MED ORDER — HEPARIN SODIUM (PORCINE) 1000 UNIT/ML IJ SOLN
INTRAMUSCULAR | Status: AC
  Filled 2023-06-07: qty 10

## 2023-06-07 MED ORDER — HEPARIN (PORCINE) IN NACL 1000-0.9 UT/500ML-% IV SOLN
INTRAVENOUS | Status: DC | PRN
  Administered 2023-06-07 (×2): 500 mL

## 2023-06-07 MED ORDER — LABETALOL HCL 5 MG/ML IV SOLN: 10.0000 mg | INTRAVENOUS | Status: DC | PRN

## 2023-06-07 MED ORDER — SODIUM CHLORIDE 0.9% FLUSH: 3.0000 mL | Freq: Two times a day (BID) | INTRAVENOUS | Status: DC

## 2023-06-07 MED ORDER — ATORVASTATIN CALCIUM 40 MG PO TABS: 80.0000 mg | ORAL_TABLET | Freq: Every day | ORAL | 6 refills | Status: DC

## 2023-06-07 MED ORDER — CLOPIDOGREL BISULFATE 75 MG PO TABS: 75.0000 mg | ORAL_TABLET | Freq: Every day | ORAL | 11 refills | Status: AC

## 2023-06-07 MED ORDER — HEPARIN SODIUM (PORCINE) 1000 UNIT/ML IJ SOLN
INTRAMUSCULAR | Status: DC | PRN
  Administered 2023-06-07: 3500 [IU] via INTRAVENOUS

## 2023-06-07 MED ORDER — SODIUM CHLORIDE 0.9 % IV SOLN: 250.0000 mL | INTRAVENOUS | Status: DC | PRN

## 2023-06-07 MED ORDER — VERAPAMIL HCL 2.5 MG/ML IV SOLN
INTRAVENOUS | Status: AC
  Filled 2023-06-07: qty 2

## 2023-06-07 MED ORDER — MIDAZOLAM HCL 2 MG/2ML IJ SOLN
INTRAMUSCULAR | Status: AC
  Filled 2023-06-07: qty 2

## 2023-06-07 MED ORDER — FENTANYL CITRATE (PF) 100 MCG/2ML IJ SOLN
INTRAMUSCULAR | Status: AC
  Filled 2023-06-07: qty 2

## 2023-06-07 MED ORDER — LIDOCAINE HCL (PF) 1 % IJ SOLN
INTRAMUSCULAR | Status: DC | PRN
  Administered 2023-06-07 (×2): 2 mL

## 2023-06-07 MED ORDER — LIDOCAINE HCL (PF) 1 % IJ SOLN
INTRAMUSCULAR | Status: AC
  Filled 2023-06-07: qty 30

## 2023-06-07 MED ORDER — MIDAZOLAM HCL 2 MG/2ML IJ SOLN
INTRAMUSCULAR | Status: DC | PRN
  Administered 2023-06-07: .5 mg via INTRAVENOUS
  Administered 2023-06-07: 1 mg via INTRAVENOUS

## 2023-06-07 MED ORDER — SODIUM CHLORIDE 0.9 % IV SOLN: INTRAVENOUS | Status: DC

## 2023-06-07 MED ORDER — VERAPAMIL HCL 2.5 MG/ML IV SOLN
INTRAVENOUS | Status: DC | PRN
  Administered 2023-06-07: 10 mL via INTRA_ARTERIAL

## 2023-06-07 MED ORDER — SODIUM CHLORIDE 0.9 % IV SOLN: INTRAVENOUS | Status: AC

## 2023-06-07 MED ORDER — ONDANSETRON HCL 4 MG/2ML IJ SOLN: 4.0000 mg | Freq: Four times a day (QID) | INTRAMUSCULAR | Status: DC | PRN

## 2023-06-07 MED ORDER — FENTANYL CITRATE (PF) 100 MCG/2ML IJ SOLN
INTRAMUSCULAR | Status: DC | PRN
  Administered 2023-06-07: 25 ug via INTRAVENOUS

## 2023-06-07 MED ORDER — HYDRALAZINE HCL 20 MG/ML IJ SOLN: 10.0000 mg | INTRAMUSCULAR | Status: DC | PRN

## 2023-06-07 MED ORDER — IOHEXOL 350 MG/ML SOLN
INTRAVENOUS | Status: DC | PRN
  Administered 2023-06-07: 40 mL

## 2023-06-07 MED ORDER — SODIUM CHLORIDE 0.9% FLUSH: 3.0000 mL | INTRAVENOUS | Status: DC | PRN

## 2023-06-07 SURGICAL SUPPLY — 13 items
CATH 5FR JL3.5 JR4 ANG PIG MP (CATHETERS) IMPLANT
CATH BALLN WEDGE 5F 110CM (CATHETERS) IMPLANT
DEVICE RAD COMP TR BAND LRG (VASCULAR PRODUCTS) IMPLANT
GLIDESHEATH SLEND SS 6F .021 (SHEATH) IMPLANT
GUIDEWIRE .025 260CM (WIRE) IMPLANT
GUIDEWIRE INQWIRE 1.5J.035X260 (WIRE) IMPLANT
INQWIRE 1.5J .035X260CM (WIRE) ×1 IMPLANT
KIT SYRINGE INJ CVI SPIKEX1 (MISCELLANEOUS) IMPLANT
PACK CARDIAC CATHETERIZATION (CUSTOM PROCEDURE TRAY) ×2 IMPLANT
SET ATX-X65L (MISCELLANEOUS) IMPLANT
SHEATH GLIDE SLENDER 4/5FR (SHEATH) IMPLANT
SHEATH PROBE COVER 6X72 (BAG) IMPLANT
WIRE HI TORQ VERSACORE-J 145CM (WIRE) IMPLANT

## 2023-06-07 NOTE — Consult Note (Signed)
NEUROLOGY CONSULT NOTE   Date of service: June 07, 2023 Patient Name: Tammy Ashley MRN:  [id number] DOB:  10/08/1945 Chief Complaint: "Migraine aura" Requesting Provider: Laurey Morale, MD  History of Present Illness  Tammy Ashley is a 78 y.o. female with hx of migraine with aura, rheumatoid arthritis, psoriasis, remote uterine cancer, heart failure, type 2 diabetes, pheochromocytoma s/p right adrenalectomy, heart failure with reduced EF (25 to 30%, with moderate RV dysfunction and mild RV enlargement)  She presented for short stay procedure today (cardiac catheterization) for evaluation of congestive heart failure  The procedure was uneventful without any intervention.  However postoperatively she had approximately 20 to 30 minutes of her typical migraine aura which is an infrequent occurrence for her.  She does note that she did not sleep well the night before due to anxiety about the procedure and her stress levels have been quite high as well  LKW: 10 AM Modified rankin score: 0-2 IV Thrombolysis: No, too mild to treat (symptoms resolved)  EVT: No, exam not consistent with LVO  NIHSS 0    ROS  ROS performed and pertinent positives documented in HPI   Past History   Relevant histories in HPI above  Family History: No family history on file.  Social History  reports that she has quit smoking. She has never used smokeless tobacco. She reports that she does not currently use alcohol. No history on file for drug use.  Allergies  Allergen Reactions   Hydrocodone-Acetaminophen Hives   Oxycodone-Acetaminophen Hives   Aspirin Rash    Other reaction(s): Unknown    Medications   Current Facility-Administered Medications:    0.9 %  sodium chloride infusion, , Intravenous, Continuous, Shirlee Latch, Dalton S, MD   0.9 %  sodium chloride infusion, , Intravenous, Continuous, McLean, Dalton S, MD   0.9 %  sodium chloride infusion, 250 mL, Intravenous, PRN, Laurey Morale, MD   fentaNYL (SUBLIMAZE) injection, , , PRN, Laurey Morale, MD, 25 mcg at 06/07/23 0757   Heparin (Porcine) in NaCl 1000-0.9 UT/500ML-% SOLN, , , PRN, Laurey Morale, MD, 500 mL at 06/07/23 0757   heparin sodium (porcine) injection, , , PRN, Laurey Morale, MD, 3,500 Units at 06/07/23 4098   hydrALAZINE (APRESOLINE) injection 10 mg, 10 mg, Intravenous, Q20 Min PRN, Laurey Morale, MD   iohexol (OMNIPAQUE) 350 MG/ML injection, , , PRN, Laurey Morale, MD, 40 mL at 06/07/23 0831   labetalol (NORMODYNE) injection 10 mg, 10 mg, Intravenous, Q10 min PRN, Laurey Morale, MD   lidocaine (PF) (XYLOCAINE) 1 % injection, , , PRN, Laurey Morale, MD, 2 mL at 06/07/23 1191   midazolam (VERSED) injection, , , PRN, Laurey Morale, MD, 0.5 mg at 06/07/23 0812   ondansetron [facility]) injection 4 mg, 4 mg, Intravenous, Q6H PRN, Laurey Morale, MD   Radial Cocktail/Verapamil only, , , PRN, Laurey Morale, MD, 10 mL at 06/07/23 0816   sodium chloride flush (NS) 0.9 % injection 3 mL, 3 mL, Intravenous, Q12H, Laurey Morale, MD   sodium chloride flush (NS) 0.9 % injection 3 mL, 3 mL, Intravenous, PRN, Laurey Morale, MD  Vitals   Vitals:   06/07/23 0906 06/07/23 0921 06/07/23 1006 06/07/23 1106  BP: 100/60 106/67 112/79 (!) 105/59  Pulse: 88 94 92 90  Resp:      Temp:      TempSrc:      SpO2: (!) 88% 94% 94% 95%  Weight:      Height:        Body mass index is 28.34 kg/m.  Physical Exam   Constitutional: Appears well-developed and well-nourished.  Psych: Affect appropriate to situation, mildly anxious but cooperative and pleasant HENT: No OP obstruction, no scleral injection Head: Normocephalic.  Cardiovascular: Normal rate and regular rhythm.  Respiratory: Effort normal, non-labored breathing.  GI: Soft.  No distension. There is no tenderness.   Neurologic Examination   Physical Exam  Constitutional: Appears well-developed and well-nourished.  Psych: Affect  appropriate to situation Eyes: No scleral injection HENT: No OP obstrucion MSK: no joint deformities.  Cardiovascular: Normal rate and regular rhythm.  Respiratory: Effort normal, non-labored breathing GI: Soft.  No distension. There is no tenderness.  Skin: WDI  Neuro: Mental Status: Patient is awake, alert, oriented to person, place, month, year, and situation. Patient is able to give a clear and coherent history. No signs of aphasia or neglect Cranial Nerves: II: Visual Fields are full. Pupils are equal, round, and reactive to light.   III,IV, VI: EOMI without ptosis or diploplia.  Mildly saccadic pursuits V: Facial sensation is symmetric to light touch VII: Facial movement is symmetric.  VIII: hearing is moderately hard of hearing at baseline X: Uvula elevates symmetrically XI: Shoulder shrug is symmetric. XII: tongue is midline without atrophy or fasciculations.  Motor: No pronator drift in the bilateral upper extremities.  Bilateral lower extremity strength testing limited by chronic hip and knee pain secondary to her rheumatoid arthritis, but no drift when legs are held antigravity Sensory: Sensation is symmetric to light touch and temperature in the arms and legs. Cerebellar: FNF and HKS are intact bilaterally  Labs/Imaging/Neurodiagnostic studies   CBC:  Recent Labs  Lab 2023/07/04 0619  WBC 5.5  HGB 15.0  HCT 45.9  MCV 86.6  PLT 344   Basic Metabolic Panel:  Lab Results  Component Value Date   NA 137 06/01/2023   K 3.7 06/01/2023   CO2 28 06/01/2023   GLUCOSE 152 (H) 06/01/2023   BUN 23 06/01/2023   CREATININE 0.54 06/01/2023   CALCIUM 9.0 06/01/2023   GFRNONAA >60 06/01/2023   GFRAA >60 04/11/2013   Lipid Panel: No results found for: "LDLCALC" HgbA1c: No results found for: "HGBA1C" Urine Drug Screen: No results found for: "LABOPIA", "COCAINSCRNUR", "LABBENZ", "AMPHETMU", "THCU", "LABBARB"  Alcohol Level No results found for: "ETH" INR  Lab Results   Component Value Date   INR 0.9 03/26/2013   APTT  Lab Results  Component Value Date   APTT 26.9 03/26/2013   AED levels: No results found for: "PHENYTOIN", "ZONISAMIDE", "LAMOTRIGINE", "LEVETIRACETA"  CT Head without contrast(Personally reviewed): No acute intracranial abnormality on my review, mildly motion limited Formal radiology report pending   ASSESSMENT   Most likely migraine aura given patient has history of the same, however given acute periprocedural event, with some heparin used during the procedure, prudent to obtain head CT to rule out acute intracranial process  RECOMMENDATIONS  -Head CT to rule out acute intracranial process in the setting of recent procedure -If this is negative, neurology will sign off --as the scan is discharge pending I called [facility] personally to expedite formal read -Discussed with Dr. Shirlee Latch at bedside and via secure chat ______________________________________________________________________   Signed, Gordy Councilman, MD [facility]

## 2023-06-07 NOTE — Progress Notes (Signed)
Dr Shirlee Latch notified of CT results and ok to d/c home

## 2023-06-07 NOTE — Discharge Instructions (Addendum)
1. Start clopidogrel (Plavix) 75 mg daily.  2. Increase atorvastatin to 80 mg daily 3. No metformin for 2 days

## 2023-06-07 NOTE — Progress Notes (Signed)
Client c/o "visual changes bilat  seeing halo's and states it is worse left eye" no other complaints; Dr Shirlee Latch notified and he will have neuro to see client

## 2023-06-07 NOTE — Interval H&P Note (Signed)
History and Physical Interval Note:  06/07/2023 7:54 AM  Tammy Ashley  has presented today for surgery, with the diagnosis of heart failure.  The various methods of treatment have been discussed with the patient and family. After consideration of risks, benefits and other options for treatment, the patient has consented to  Procedure(s): RIGHT/LEFT HEART CATH AND CORONARY ANGIOGRAPHY (N/A) as a surgical intervention.  The patient's history has been reviewed, patient examined, no change in status, stable for surgery.  I have reviewed the patient's chart and labs.  Questions were answered to the patient's satisfaction.     Fredrico Beedle [employer]

## 2023-06-08 LAB — POCT I-STAT EG7
Acid-Base Excess: 1 mmol/L (ref 0.0–2.0)
Acid-Base Excess: 2 mmol/L (ref 0.0–2.0)
Bicarbonate: 28.5 mmol/L — ABNORMAL HIGH (ref 20.0–28.0)
Bicarbonate: 29.5 mmol/L — ABNORMAL HIGH (ref 20.0–28.0)
Calcium, Ion: 1.17 mmol/L (ref 1.15–1.40)
Calcium, Ion: 1.21 mmol/L (ref 1.15–1.40)
HCT: 44 % (ref 36.0–46.0)
HCT: 45 % (ref 36.0–46.0)
Hemoglobin: 15 g/dL (ref 12.0–15.0)
Hemoglobin: 15.3 g/dL — ABNORMAL HIGH (ref 12.0–15.0)
O2 Saturation: 63 %
O2 Saturation: 72 %
Potassium: 3.6 mmol/L (ref 3.5–5.1)
Potassium: 3.7 mmol/L (ref 3.5–5.1)
Sodium: 139 mmol/L (ref 135–145)
Sodium: 144 mmol/L (ref 135–145)
TCO2: 30 mmol/L (ref 22–32)
TCO2: 31 mmol/L (ref 22–32)
pCO2, Ven: 57 mm[Hg] (ref 44–60)
pCO2, Ven: 57.5 mm[Hg] (ref 44–60)
pH, Ven: 7.303 (ref 7.25–7.43)
pH, Ven: 7.323 (ref 7.25–7.43)
pO2, Ven: 37 mm[Hg] (ref 32–45)
pO2, Ven: 42 mm[Hg] (ref 32–45)

## 2023-06-08 MED FILL — Heparin Sodium (Porcine) Inj 1000 Unit/ML: INTRAMUSCULAR | Qty: 10 | Status: AC

## 2023-06-08 MED FILL — Lidocaine HCl Local Preservative Free (PF) Inj 1%: INTRAMUSCULAR | Qty: 30 | Status: AC

## 2023-06-13 NOTE — Progress Notes (Unsigned)
Advanced Heart Failure Clinic Note  PCP: Adrian Prince, MD PCP-Cardiologist: None HF-Cardiologist: {[facility] Cardiologist:210917259}  HPI:  78 y.o. with history of DM2, rheumatoid arthritis, pheochromocytoma s/p right adrenalectomy was referred by Dr. Evlyn Kanner for evaluation of CHF given several month history of exertional dyspnea,  and lower extremity swelling.  She was started on Lasix  by Dr. Evlyn Kanner and reported losing about 10 lbs over a few weeks.  She is also limited chronically by joint pain from rheumatoid arthritis. Echo was done in 04/2023, showing EF 25-30%, moderate RV dysfunction, mild RV enlargement.   Seen by Dr. Shirlee Latch on 05/22/23 where Tammy Ashley reported limitations in ADLs due to CHF and RA, requiring a wheelchair for most activities. At that visit, lisinopril was transitioned to Entresto 24-26 mg BID and Farxiga 10 mg daily was added.  Catheterization 06/07/23 showed proximal LAD to mid LAD lesion 100% stenosed (CTO) with minimal collaterals and mid RCA lesion 20% stenosed. Intervention was not performed due to probable lack of benefit. Filling pressures and cardiac output/index were normal. PA pressure was elevated 44/19 (31) with PVR of 4.6.  Today Tammy Ashley returns to [facility] Failure Clinic for pharmacist medication titration. Reports feeling ***. {Reports/Denies:210917258} {ACTIONS;DENIES/REPORTS:21021675::"Denies"} being able to complete all activities of daily living (ADLs). Is *** active throughout the day. Weight at home is *** pounds. Takes {CHL AMB [facility] Medications:210917260} ***. Appetite ***. {Does Follow/Does Not Follow:210917261} a low sodium diet.  Current Heart Failure Medications: Loop diuretic: furosemide 20 mg daly Beta-Blocker: ACEI/ARB/ARNI: Entresto 24-26 mg twice daily MRA: SGLT2i: Farxiga 10 mg daily Other:  Has the patient been experiencing any side effects to the medications prescribed? {yes/no:20286}  Does the patient have any problems  obtaining medications due to transportation or finances? {yes/no:20286}  Understanding of regimen: {CHL AMB [facility] Excellent/Good/Fair/Poor:210917262}  Understanding of indications: {CHL AMB [facility] Excellent/Good/Fair/Poor:210917262}  Potential of adherence: {CHL AMB [facility] Excellent/Good/Fair/Poor:210917262}  Patient understands to avoid NSAIDs.  Patient understands to avoid decongestants.  Pertinent Lab Values: Creatinine  Date Value Ref Range Status  04/11/2013 0.56 (L) 0.60 - 1.30 mg/dL Final   Creatinine, Ser  Date Value Ref Range Status  06/01/2023 0.54 0.44 - 1.00 mg/dL Final   BUN  Date Value Ref Range Status  06/01/2023 23 8 - 23 mg/dL Final  40/98/1191 21 8 - 27 mg/dL Final  47/82/9562 5 (L) 7 - 18 mg/dL Final   Potassium  Date Value Ref Range Status  06/07/2023 3.6 3.5 - 5.1 mmol/L Final  06/07/2023 3.7 3.5 - 5.1 mmol/L Final  04/11/2013 3.5 3.5 - 5.1 mmol/L Final   Sodium  Date Value Ref Range Status  06/07/2023 139 135 - 145 mmol/L Final  06/07/2023 144 135 - 145 mmol/L Final  05/22/2023 140 134 - 144 mmol/L Final  04/11/2013 138 136 - 145 mmol/L Final   BNP  Date Value Ref Range Status  05/22/2023 462.3 (H) 0.0 - 100.0 pg/mL Final    Comment:    [employer] AD[employer] XP methodology    Vital Signs: There were no vitals filed for this visit.  Assessment/Plan: 1. Chronic systolic CHF: Echo in 04/2023 showed EF 25-30%, moderate RV dysfunction, mild RV enlargement.  Patient has had exertional dyspnea and peripheral edema for several months now.  No chest pain. Cause of cardiomyopathy is uncertain.  She does not have a family history of heart failure and does not use drugs or drink heavily.  She has a history of HTN but BP has been controlled. No history  of atrial fibrillation or arrhythmia.  On exam, she is mildly volume overloaded.  NYHA class 3 symptoms.   - Continue Lasix 20 mg daily.  - I will add Farxiga 10 mg daily, this will add some additional  diuresis. - Stop lisinopril, in 36 hrs she will start Entresto 24/26 bid. BMET/BNP today, BMET in 10 days.  - I will arrange for LHC/RHC to assess filling pressures, CO and to rule out CAD as a cause of her cardiomyopathy. We discussed risks/benefits and she agrees to procedure.  - If there is no significant CAD on cath, I will arrange for cardiac MRI.  2. HTN: BP is controlled.  3. H/o right adrenal pheochromocytoma: S/p right adrenalectomy.  4. Hyperlipidemia: Continue atorvastatin.  5. Rheumatoid arthritis: This limits her ambulation due to joint pain.   Follow up: ***  ***

## 2023-06-14 ENCOUNTER — Other Ambulatory Visit: Payer: Self-pay | Admitting: Cardiology

## 2023-06-14 ENCOUNTER — Ambulatory Visit: Payer: Medicare Other | Attending: Cardiology | Admitting: Pharmacist

## 2023-06-14 VITALS — BP 112/70 | HR 105 | Wt 160.8 lb

## 2023-06-14 DIAGNOSIS — I5022 Chronic systolic (congestive) heart failure: Secondary | ICD-10-CM

## 2023-06-14 MED ORDER — METOPROLOL SUCCINATE ER 25 MG PO TB24: 12.5000 mg | ORAL_TABLET | Freq: Every day | ORAL | 3 refills | Status: DC

## 2023-06-14 MED ORDER — ATORVASTATIN CALCIUM 40 MG PO TABS: 80.0000 mg | ORAL_TABLET | Freq: Every day | ORAL | 3 refills | Status: DC

## 2023-06-14 MED ORDER — FUROSEMIDE 40 MG PO TABS: ORAL_TABLET | ORAL | Status: DC

## 2023-06-14 NOTE — Patient Instructions (Signed)
It was a pleasure seeing you today!  MEDICATIONS: -We are changing your medications today -Start metoprolol succinate 12.5 mg  (1/2 tablet) at bedtime -Changing furosemide (Lasix) to 20 mg as needed for weight gain of 2 lbs in 24 hours -You are fine to continue atorvastatin 40 mg daily for now. We may consider rosuvastatin instead if your cholesterol labs do not improve.  -Call if you have questions about your medications.  LABS: -We will call you if your labs need attention.  NEXT APPOINTMENT: Return to clinic in 5 days with Dr. Shirlee Latch.  In general, to take care of your heart failure: -Limit your fluid intake to 2 Liters (half-gallon) per day.   -Limit your salt intake to ideally 2-3 grams (2000-3000 mg) per day. -Weigh yourself daily and record, and bring that "weight diary" to your next appointment.  (Weight gain of 2-3 pounds in 1 day typically means fluid weight.) -The medications for your heart are to help your heart and help you live longer.   -Please contact us before stopping any of your heart medications.  Call the clinic at [phone] with questions or to reschedule future appointments.

## 2023-06-19 ENCOUNTER — Telehealth: Payer: Self-pay | Admitting: Cardiology

## 2023-06-19 NOTE — Telephone Encounter (Signed)
Pt confirmed appt for 06/20/23

## 2023-06-20 ENCOUNTER — Ambulatory Visit: Payer: Medicare Other | Attending: Cardiology | Admitting: Cardiology

## 2023-06-20 VITALS — BP 116/69 | HR 90 | Wt 162.0 lb

## 2023-06-20 DIAGNOSIS — I5022 Chronic systolic (congestive) heart failure: Secondary | ICD-10-CM

## 2023-06-20 DIAGNOSIS — E782 Mixed hyperlipidemia: Secondary | ICD-10-CM

## 2023-06-20 MED ORDER — METOPROLOL SUCCINATE ER 25 MG PO TB24: 12.5000 mg | ORAL_TABLET | Freq: Every evening | ORAL | 3 refills | Status: DC

## 2023-06-20 MED ORDER — SPIRONOLACTONE 25 MG PO TABS: 12.5000 mg | ORAL_TABLET | Freq: Every day | ORAL | 3 refills | Status: DC

## 2023-06-20 NOTE — Patient Instructions (Signed)
 START Spironolactone  12.5 mg ( 1/2 Tab) nightly.  CHANGE Toprol  XL to nightly.  Go DOWN to LOWER LEVEL (LL) to have your blood work completed inside of [employer] office.  Go over to the [facility]ICAL [facility]ALL. Go pass the gift shop and have your blood work completed.  We will only call you if the results are abnormal or if the provider would like to make medication changes.  Please follow up with our heart failure pharmacist in 2 weeks.  Your physician recommends that you schedule a follow-up appointment in: 1 month.  If you have any questions or concerns before your next appointment please send us  a message through [place] or call our office at [phone]    TO LEAVE A MESSAGE FOR THE NURSE SELECT OPTION 2, PLEASE LEAVE A MESSAGE INCLUDING: YOUR NAME DATE OF BIRTH CALL BACK NUMBER REASON FOR CALL**this is important as we prioritize the call backs  YOU WILL RECEIVE A CALL BACK THE SAME DAY AS LONG AS YOU CALL BEFORE 4:00 PM  At the [facility] Clinic, you and your health needs are our priority. As part of our continuing mission to provide you with exceptional heart care, we have created designated Provider Care Teams. These Care Teams include your primary Cardiologist (physician) and Advanced Practice Providers (APPs- Physician Assistants and Nurse Practitioners) who all work together to provide you with the care you need, when you need it.   You may see any of the following providers on your designated Care Team at your next follow up: Dr Toribio Fuel Dr Ezra Shuck Dr. Ria Commander Dr. Morene Brownie Amy Lenetta, NP Caffie Shed, [place] [facility] [place], [place] Beckey Coe, NP Jordan Lee, NP Ellouise Class, NP Jaun Pili, PharmD   Please be sure to bring in all your medications bottles to every appointment.    Thank you for choosing [facility]

## 2023-06-21 ENCOUNTER — Other Ambulatory Visit: Payer: Self-pay | Admitting: Cardiology

## 2023-06-21 ENCOUNTER — Telehealth (HOSPITAL_COMMUNITY): Payer: Self-pay

## 2023-06-21 LAB — LIPID PANEL
Chol/HDL Ratio: 2.9 {ratio} (ref 0.0–4.4)
Cholesterol, Total: 150 mg/dL (ref 100–199)
HDL: 52 mg/dL (ref >39)
LDL Chol Calc (NIH): 77 mg/dL (ref 0–99)
Triglycerides: 115 mg/dL (ref 0–149)
VLDL Cholesterol Cal: 21 mg/dL (ref 5–40)

## 2023-06-21 LAB — BASIC METABOLIC PANEL
BUN/Creatinine Ratio: 25 (ref 12–28)
BUN: 14 mg/dL (ref 8–27)
CO2: 24 mmol/L (ref 20–29)
Calcium: 9.5 mg/dL (ref 8.7–10.3)
Chloride: 101 mmol/L (ref 96–106)
Creatinine, Ser: 0.57 mg/dL (ref 0.57–1.00)
Glucose: 118 mg/dL — ABNORMAL HIGH (ref 70–99)
Potassium: 4.3 mmol/L (ref 3.5–5.2)
Sodium: 142 mmol/L (ref 134–144)
eGFR: 94 mL/min/{1.73_m2} (ref >59)

## 2023-06-21 LAB — LIPOPROTEIN A (LPA): Lipoprotein (a): 16 nmol/L (ref <75.0)

## 2023-06-21 LAB — BRAIN NATRIURETIC PEPTIDE: BNP: 540.5 pg/mL — ABNORMAL HIGH (ref 0.0–100.0)

## 2023-06-21 NOTE — Progress Notes (Signed)
 PCP: Nichole Senior, MD HF Cardiology: Dr. Rolan  Chief Complaint: CHF  78 y.o. with history of DM2, rheumatoid arthritis, pheochromocytoma s/p right adrenalectomy was referred by Dr. Nichole for evaluation of CHF.  No prior cardiac history.  For several months prior to initial visit, she had noted exertional dyspnea, and for 2-3 months she had had lower leg swelling.  She is also limited chronically by joint pain from rheumatoid arthritis.   Echo was done in 12/24, showing EF 25-30%, moderate RV dysfunction, mild RV enlargement.   She had RHC/LHC in 1/25, showing normal filling pressures, mild pulmonary hypertension and preserved CO.  The proximally LAD was chronically totally occluded with minimal collaterals.   She returns today for followup of CHF and CAD. She uses a walker around the house due to arthritis, uses a walker or wheelchair when she leaves the house.  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  She fatigues easily.  She is not short of breath walking around the house with her walker.  No chest pain. No lightheadedness or palpitations but SBP runs relatively low at home, 90s-100s.  Weight is stable.   ECG (personally reviewed): NSR, RBBB, LAFB  Labs (5/24): K 4, creatinine 0.7, LDL 77 Labs (10/24): TSH normal, hgb 14.5 Labs (1/25): K 3.7, creatinine 0.54  PMH: 1. Type 2 diabetes 2. Hyperlipidemia 3. 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  5. Endometrial cancer: 2010.  6. Chronic systolic CHF: Echo (12/24) with EF 25-30%, moderate RV dysfunction, mild RV enlargement.  - LHC/RHC (1/25): Mean RA 6, PA 44/16, mean PCWP 8, CI 2.96; occluded proximal LAD (chronic).  7. CAD: Cath in 1/25 with chronically occluded proximal LAD.   FH: Brother with PCI  SH: Originally from [place], lives in [place]. Widow.  No smoking or ETOH. 2 children.   ROS: All systems reviewed and negative except as per HPI.   Current  Outpatient Medications  Medication Sig Dispense Refill   Abatacept  (ORENCIA ) 125 MG/ML SOSY Inject 125 mg into the skin every 30 (thirty) days.     acetaminophen  (TYLENOL ) 650 MG CR tablet Take 1,300 mg by mouth every morning.     atorvastatin  (LIPITOR) 80 MG tablet Take 1 tablet (80 mg total) by mouth daily. 90 tablet 3   clopidogrel  (PLAVIX ) 75 MG tablet Take 1 tablet (75 mg total) by mouth daily. 30 tablet 11   CONTOUR NEXT TEST test strip USE TO SELF MONITOR BLOOD GLUCOSE DAILY DX E11.9     cycloSPORINE (RESTASIS) 0.05 % ophthalmic emulsion Place 1 drop into both eyes 2 (two) times daily.      diphenhydramine -acetaminophen  (TYLENOL  PM) 25-500 MG TABS tablet Take 1-2 tablets by mouth at bedtime.     ergocalciferol (VITAMIN D2) 50000 units capsule Take 50,000 Units by mouth once a week.     FARXIGA  10 MG TABS tablet Take 1 tablet (10 mg total) by mouth daily before breakfast. 90 tablet 3   leflunomide (ARAVA) 10 MG tablet Take 10 mg by mouth daily.     metFORMIN (GLUCOPHAGE-XR) 500 MG 24 hr tablet Take 500 mg by mouth 2 (two) times daily.     Multiple Vitamins-Minerals (MULTIVITAMIN WITH MINERALS) tablet Take 1 tablet by mouth daily.     sacubitril -valsartan  (ENTRESTO ) 24-26 MG Take 1 tablet by mouth 2 (two) times daily. 60 tablet 11   spironolactone  (ALDACTONE ) 25 MG tablet Take 0.5 tablets (12.5 mg total) by mouth daily. 45 tablet 3  furosemide  (LASIX ) 40 MG tablet Take 20 mg by mouth as needed for weight gain or shortness of breath (Patient not taking: Reported on 06/20/2023)     metoprolol  succinate (TOPROL  XL) 25 MG 24 hr tablet Take 0.5 tablets (12.5 mg total) by mouth at bedtime. 45 tablet 3   No current facility-administered medications for this visit.   BP 116/69   Pulse 90   Wt 162 lb (73.5 kg)   SpO2 96%   BMI 30.61 kg/m  General: NAD Neck: No JVD, no thyromegaly or thyroid  nodule.  Lungs: Clear to auscultation bilaterally with normal respiratory effort. CV: Nondisplaced  PMI.  Heart regular S1/S2, no S3/S4, no murmur.  No peripheral edema.  No carotid bruit.  Normal pedal pulses.  Abdomen: Soft, nontender, no hepatosplenomegaly, no distention.  Skin: Intact without lesions or rashes.  Neurologic: Alert and oriented x 3.  Psych: Normal affect. Extremities: No clubbing or cyanosis.  HEENT: Normal.   Assessment/Plan: 1. Chronic systolic CHF: Ischemic cardiomyopathy.  Echo in 12/24 showed EF 25-30%, moderate RV dysfunction, mild RV enlargement.  LHC/RHC in 1/25 showed occluded proximal LAD that is likely the culprit for her cardiomyopathy.  RHC showed normal filling pressure, preserved cardiac output, mild pulmonary arterial hypertension.  She is not volume overloaded on exam today, NYHA class II-III symptoms (confounded by arthritis).  She does not have a lot of BP room for medication titration but denies lightheadedness.  - Continue Lasix  20 mg daily prn.   - Continue Farxiga  10 mg daily.  - Contineu Entresto  24/26 bid.  - Continue Toprol  XL 12.5 daily.  - Add spironolactone  12.5 daily with BMET/BNP today and BMET in 10 days.  - Repeat echo in 3 more months after medication titration.  If EF remains < 35%, will need to consider ICD.  Narrow QRS, not CRT candidate.  - I will refer to cardiac rehab at [facility].  2. HTN: BP is now on the lower side.   3. H/o right adrenal pheochromocytoma: S/p right adrenalectomy.  4. Hyperlipidemia: Continue atorvastatin .  - Check lipids and lipoprotein(a) today.  Want LDL < 55.  5. Rheumatoid arthritis: This limits her ambulation due to joint pain.  6. CAD: LHC in 1/25 with chronic occlusion of the proximal LAD without significant collaterals.  No target for PCI or CABG.  She does not have a history of a severe chest pain event. Needs aggressive secondary prevention.  - Continue Plavix .  - Continue atorvastatin , check lipids and lipoprotein(a) as above.   Followup in 2 wks with HF pharmacist, see me in 1 month.   I spent 31  minutes reviewing records, interviewing/examining patient, and managing orders.   Ezra Shuck 06/21/2023

## 2023-06-21 NOTE — Telephone Encounter (Signed)
 Patient Advocate Encounter  Returned patient call to office regarding Farxiga , Entresto .   Patient received an explanation of benefits from insurance showing previous copays. I have confirmed that the patient did not pay for either medication when picking up, and informed her that as the [employer] grant covers the copay of both medications, the insurance EOB will still show the original copay/pricing. Patient expressed understanding.  Rachel DEL, CPhT Rx Patient Advocate Phone: [phone]

## 2023-06-22 ENCOUNTER — Telehealth: Payer: Self-pay | Admitting: Pharmacist

## 2023-06-22 ENCOUNTER — Other Ambulatory Visit (HOSPITAL_COMMUNITY): Payer: Self-pay

## 2023-06-22 ENCOUNTER — Telehealth: Payer: Self-pay

## 2023-06-22 NOTE — Telephone Encounter (Signed)
 Prior authorization send and approved for Repatha sureclick 140 gm every 14 days. Patient is scheduled for PharmD visit in 2 weeks, will initiate at that time. PA# [id number].

## 2023-06-22 NOTE — Telephone Encounter (Addendum)
 Patient wishes to wait and talk with Nason at her appointment on 07/05/23 before she decides is she wishes to start it.  ----- Message from Jaun GORMAN Bash sent at 06/22/2023 11:08 AM EST ----- Regarding: RE: Repatha  PA number [id number] ----- Message ----- From: Marcelina Lisa HERO, RN Sent: 06/22/2023   9:17 AM EST To: Jaun GORMAN Bash, RPH-CPP Subject: Repatha                                         She has an appointment with you on 07/05/23, so you can start the process then ----- Message ----- From: Rolan Ezra GORMAN, MD Sent: 06/21/2023   9:43 PM EST To: [facility] Hrt Triage  Goal LDL < 55.  Refer to pharmacy clinic  to start on Repatha .

## 2023-06-22 NOTE — Telephone Encounter (Signed)
-----   Message from Nurse Emer C sent at 06/22/2023  9:16 AM EST ----- Regarding: Repatha  She has an appointment with you on 07/05/23, so you can start the process then ----- Message ----- From: Rolan Ezra RAMAN, MD Sent: 06/21/2023   9:43 PM EST To: [facility]rt Triage  Goal LDL < 55.  Refer to pharmacy clinic  to start on Repatha .

## 2023-06-26 ENCOUNTER — Telehealth: Payer: Self-pay | Admitting: Pharmacist

## 2023-06-26 NOTE — Telephone Encounter (Signed)
Patient called with complaint of frequent urination interfering with sleep. She is currently taking spironolactone at bedtime. She was informed to start taking it in the morning and decrease water intake to mitigate this. She also asked when she will be able to start Orencia again for arthritis. Informed her that Dr. Shirlee Latch stated that she is fine to begin them from a cardiac perspective.

## 2023-06-29 ENCOUNTER — Telehealth: Payer: Self-pay | Admitting: Pharmacist

## 2023-06-29 NOTE — Telephone Encounter (Signed)
Patient left voicemail noting acid reflux and severe nausea that was sudden onset. Called patient 3 times with no response. Unable to leave voicemail. Called patient's son whom Ms. Denomme mentioned via voicemail stayed with her last night. He mentioned that his has happened the same time every month for the last few months. She did not take any medications this AM due to nausea. Reports she is anxious. Reports her weight is down 5 lbs. Recommended to the patient's son to report to an urgent care or ED today for evaluation if symptoms persist given her history of coronary disease. He confirmed understanding.

## 2023-06-30 ENCOUNTER — Encounter
Admission: EM | Disposition: A | Discharge status: Skilled Nursing Facility | Payer: Self-pay | Source: Home / Self Care | Attending: Internal Medicine

## 2023-06-30 ENCOUNTER — Emergency Department: Payer: Medicare Other

## 2023-06-30 ENCOUNTER — Inpatient Hospital Stay: Payer: Medicare Other | Admitting: General Practice

## 2023-06-30 ENCOUNTER — Other Ambulatory Visit: Payer: Self-pay

## 2023-06-30 ENCOUNTER — Inpatient Hospital Stay
Admission: EM | Admit: 2023-06-30 | Discharge: 2023-07-04 | DRG: 329 | Disposition: A | Discharge status: Skilled Nursing Facility | Payer: Medicare Other | Attending: Internal Medicine | Admitting: Internal Medicine

## 2023-06-30 DIAGNOSIS — Z90710 Acquired absence of both cervix and uterus: Secondary | ICD-10-CM

## 2023-06-30 DIAGNOSIS — R059 Cough, unspecified: Secondary | ICD-10-CM | POA: Diagnosis not present

## 2023-06-30 DIAGNOSIS — Z7902 Long term (current) use of antithrombotics/antiplatelets: Secondary | ICD-10-CM | POA: Diagnosis not present

## 2023-06-30 DIAGNOSIS — I272 Pulmonary hypertension, unspecified: Secondary | ICD-10-CM | POA: Diagnosis present

## 2023-06-30 DIAGNOSIS — I110 Hypertensive heart disease with heart failure: Secondary | ICD-10-CM | POA: Diagnosis present

## 2023-06-30 DIAGNOSIS — K567 Ileus, unspecified: Secondary | ICD-10-CM | POA: Diagnosis not present

## 2023-06-30 DIAGNOSIS — I5022 Chronic systolic (congestive) heart failure: Secondary | ICD-10-CM | POA: Diagnosis present

## 2023-06-30 DIAGNOSIS — Z87891 Personal history of nicotine dependence: Secondary | ICD-10-CM | POA: Diagnosis not present

## 2023-06-30 DIAGNOSIS — K658 Other peritonitis: Secondary | ICD-10-CM | POA: Diagnosis present

## 2023-06-30 DIAGNOSIS — I2510 Atherosclerotic heart disease of native coronary artery without angina pectoris: Secondary | ICD-10-CM

## 2023-06-30 DIAGNOSIS — I959 Hypotension, unspecified: Secondary | ICD-10-CM | POA: Diagnosis not present

## 2023-06-30 DIAGNOSIS — K430 Incisional hernia with obstruction, without gangrene: Secondary | ICD-10-CM | POA: Diagnosis present

## 2023-06-30 DIAGNOSIS — E785 Hyperlipidemia, unspecified: Secondary | ICD-10-CM | POA: Diagnosis present

## 2023-06-30 DIAGNOSIS — Z8542 Personal history of malignant neoplasm of other parts of uterus: Secondary | ICD-10-CM

## 2023-06-30 DIAGNOSIS — Z886 Allergy status to analgesic agent status: Secondary | ICD-10-CM

## 2023-06-30 DIAGNOSIS — K9189 Other postprocedural complications and disorders of digestive system: Secondary | ICD-10-CM | POA: Diagnosis not present

## 2023-06-30 DIAGNOSIS — E119 Type 2 diabetes mellitus without complications: Secondary | ICD-10-CM

## 2023-06-30 DIAGNOSIS — E860 Dehydration: Secondary | ICD-10-CM | POA: Diagnosis present

## 2023-06-30 DIAGNOSIS — E896 Postprocedural adrenocortical (-medullary) hypofunction: Secondary | ICD-10-CM | POA: Diagnosis present

## 2023-06-30 DIAGNOSIS — F419 Anxiety disorder, unspecified: Secondary | ICD-10-CM | POA: Diagnosis present

## 2023-06-30 DIAGNOSIS — G479 Sleep disorder, unspecified: Secondary | ICD-10-CM | POA: Diagnosis present

## 2023-06-30 DIAGNOSIS — Z885 Allergy status to narcotic agent status: Secondary | ICD-10-CM | POA: Diagnosis not present

## 2023-06-30 DIAGNOSIS — D3500 Benign neoplasm of unspecified adrenal gland: Secondary | ICD-10-CM | POA: Insufficient documentation

## 2023-06-30 DIAGNOSIS — Z7984 Long term (current) use of oral hypoglycemic drugs: Secondary | ICD-10-CM | POA: Diagnosis not present

## 2023-06-30 DIAGNOSIS — M069 Rheumatoid arthritis, unspecified: Secondary | ICD-10-CM | POA: Diagnosis present

## 2023-06-30 DIAGNOSIS — K56609 Unspecified intestinal obstruction, unspecified as to partial versus complete obstruction: Secondary | ICD-10-CM

## 2023-06-30 DIAGNOSIS — I9589 Other hypotension: Secondary | ICD-10-CM | POA: Diagnosis present

## 2023-06-30 DIAGNOSIS — I2582 Chronic total occlusion of coronary artery: Secondary | ICD-10-CM | POA: Diagnosis present

## 2023-06-30 DIAGNOSIS — I10 Essential (primary) hypertension: Secondary | ICD-10-CM | POA: Diagnosis not present

## 2023-06-30 DIAGNOSIS — Z79899 Other long term (current) drug therapy: Secondary | ICD-10-CM

## 2023-06-30 DIAGNOSIS — K565 Intestinal adhesions [bands], unspecified as to partial versus complete obstruction: Principal | ICD-10-CM

## 2023-06-30 HISTORY — PX: BOWEL RESECTION: SHX1257

## 2023-06-30 HISTORY — PX: XI ROBOTIC ASSISTED VENTRAL HERNIA: SHX6789

## 2023-06-30 HISTORY — DX: Rheumatoid arthritis, unspecified: M06.9

## 2023-06-30 HISTORY — DX: Pulmonary hypertension, unspecified: I27.20

## 2023-06-30 HISTORY — DX: Type 2 diabetes mellitus without complications: E11.9

## 2023-06-30 HISTORY — DX: Hyperlipidemia, unspecified: E78.5

## 2023-06-30 HISTORY — DX: Atherosclerotic heart disease of native coronary artery without angina pectoris: I25.10

## 2023-06-30 HISTORY — DX: Heart failure, unspecified: I50.9

## 2023-06-30 LAB — COMPREHENSIVE METABOLIC PANEL
ALT: 10 U/L (ref 0–44)
AST: 23 U/L (ref 15–41)
Albumin: 3.7 g/dL (ref 3.5–5.0)
Alkaline Phosphatase: 41 U/L (ref 38–126)
Anion gap: 14 (ref 5–15)
BUN: 21 mg/dL (ref 8–23)
CO2: 23 mmol/L (ref 22–32)
Calcium: 9.2 mg/dL (ref 8.9–10.3)
Chloride: 100 mmol/L (ref 98–111)
Creatinine, Ser: 0.59 mg/dL (ref 0.44–1.00)
GFR, Estimated: >60 mL/min (ref >60)
Glucose, Bld: 169 mg/dL — ABNORMAL HIGH (ref 70–99)
Potassium: 4.6 mmol/L (ref 3.5–5.1)
Sodium: 137 mmol/L (ref 135–145)
Total Bilirubin: 1.7 mg/dL — ABNORMAL HIGH (ref 0.0–1.2)
Total Protein: 6.8 g/dL (ref 6.5–8.1)

## 2023-06-30 LAB — LIPASE, BLOOD: Lipase: 19 U/L (ref 11–51)

## 2023-06-30 LAB — URINALYSIS, ROUTINE W REFLEX MICROSCOPIC
Bacteria, UA: NONE SEEN
Bilirubin Urine: NEGATIVE
Glucose, UA: >=500 mg/dL — AB
Hgb urine dipstick: NEGATIVE
Ketones, ur: 20 mg/dL — AB
Leukocytes,Ua: NEGATIVE
Nitrite: NEGATIVE
Protein, ur: 30 mg/dL — AB
Specific Gravity, Urine: 1.039 — ABNORMAL HIGH (ref 1.005–1.030)
pH: 5 (ref 5.0–8.0)

## 2023-06-30 LAB — CBC
HCT: 49.3 % — ABNORMAL HIGH (ref 36.0–46.0)
Hemoglobin: 16 g/dL — ABNORMAL HIGH (ref 12.0–15.0)
MCH: 28.5 pg (ref 26.0–34.0)
MCHC: 32.5 g/dL (ref 30.0–36.0)
MCV: 87.9 fL (ref 80.0–100.0)
Platelets: 337 10*3/uL (ref 150–400)
RBC: 5.61 MIL/uL — ABNORMAL HIGH (ref 3.87–5.11)
RDW: 15.2 % (ref 11.5–15.5)
WBC: 8.2 10*3/uL (ref 4.0–10.5)
nRBC: 0 % (ref 0.0–0.2)

## 2023-06-30 LAB — BRAIN NATRIURETIC PEPTIDE: B Natriuretic Peptide: 716.6 pg/mL — ABNORMAL HIGH (ref 0.0–100.0)

## 2023-06-30 SURGERY — REPAIR, HERNIA, VENTRAL, ROBOT-ASSISTED
Anesthesia: General

## 2023-06-30 MED ORDER — HYDROMORPHONE HCL 1 MG/ML IJ SOLN
0.5000 mg | INTRAMUSCULAR | Status: DC | PRN
  Administered 2023-07-01 – 2023-07-02 (×4): 0.5 mg via INTRAVENOUS
  Filled 2023-06-30 (×4): qty 0.5

## 2023-06-30 MED ORDER — ETOMIDATE 2 MG/ML IV SOLN
INTRAVENOUS | Status: AC
  Filled 2023-06-30: qty 10

## 2023-06-30 MED ORDER — ONDANSETRON HCL 4 MG/2ML IJ SOLN
4.0000 mg | Freq: Once | INTRAMUSCULAR | Status: AC
  Administered 2023-06-30: 4 mg via INTRAVENOUS
  Filled 2023-06-30: qty 2

## 2023-06-30 MED ORDER — ONDANSETRON HCL 4 MG/2ML IJ SOLN
4.0000 mg | Freq: Four times a day (QID) | INTRAMUSCULAR | Status: DC | PRN
Start: 2023-06-30 — End: 2023-07-04

## 2023-06-30 MED ORDER — DEXAMETHASONE SODIUM PHOSPHATE 10 MG/ML IJ SOLN
INTRAMUSCULAR | Status: DC | PRN
  Administered 2023-06-30: 5 mg via INTRAVENOUS

## 2023-06-30 MED ORDER — SUCCINYLCHOLINE CHLORIDE 200 MG/10ML IV SOSY
PREFILLED_SYRINGE | INTRAVENOUS | Status: DC | PRN
  Administered 2023-06-30: 100 mg via INTRAVENOUS

## 2023-06-30 MED ORDER — ONDANSETRON HCL 4 MG PO TABS
4.0000 mg | ORAL_TABLET | Freq: Four times a day (QID) | ORAL | Status: DC | PRN
Start: 2023-06-30 — End: 2023-07-04

## 2023-06-30 MED ORDER — ROCURONIUM BROMIDE 10 MG/ML (PF) SYRINGE
PREFILLED_SYRINGE | INTRAVENOUS | Status: AC
  Filled 2023-06-30: qty 10

## 2023-06-30 MED ORDER — CEFAZOLIN SODIUM-DEXTROSE 2-3 GM-%(50ML) IV SOLR
INTRAVENOUS | Status: DC | PRN
  Administered 2023-06-30: 2 g via INTRAVENOUS

## 2023-06-30 MED ORDER — LIDOCAINE HCL (PF) 2 % IJ SOLN
INTRAMUSCULAR | Status: AC
  Filled 2023-06-30: qty 5

## 2023-06-30 MED ORDER — PHENYLEPHRINE HCL-NACL 20-0.9 MG/250ML-% IV SOLN
INTRAVENOUS | Status: DC | PRN
  Administered 2023-06-30: 50 ug/min via INTRAVENOUS

## 2023-06-30 MED ORDER — BUPIVACAINE-EPINEPHRINE (PF) 0.25% -1:200000 IJ SOLN
INTRAMUSCULAR | Status: AC
  Filled 2023-06-30: qty 30

## 2023-06-30 MED ORDER — ETOMIDATE 2 MG/ML IV SOLN
INTRAVENOUS | Status: DC | PRN
  Administered 2023-06-30: 20 mg via INTRAVENOUS

## 2023-06-30 MED ORDER — EPHEDRINE 5 MG/ML INJ
INTRAVENOUS | Status: AC
  Filled 2023-06-30: qty 5

## 2023-06-30 MED ORDER — 0.9 % SODIUM CHLORIDE (POUR BTL) OPTIME
TOPICAL | Status: DC | PRN
Start: 2023-06-30 — End: 2023-07-01
  Administered 2023-06-30: 500 mL

## 2023-06-30 MED ORDER — DEXAMETHASONE SODIUM PHOSPHATE 10 MG/ML IJ SOLN
INTRAMUSCULAR | Status: AC
  Filled 2023-06-30: qty 1

## 2023-06-30 MED ORDER — ACETAMINOPHEN 10 MG/ML IV SOLN
INTRAVENOUS | Status: DC | PRN
  Administered 2023-06-30: 1000 mg via INTRAVENOUS

## 2023-06-30 MED ORDER — LIDOCAINE HCL (CARDIAC) PF 100 MG/5ML IV SOSY
PREFILLED_SYRINGE | INTRAVENOUS | Status: DC | PRN
  Administered 2023-06-30: 60 mg via INTRAVENOUS

## 2023-06-30 MED ORDER — FENTANYL CITRATE (PF) 100 MCG/2ML IJ SOLN
INTRAMUSCULAR | Status: DC | PRN
  Administered 2023-06-30: 50 ug via INTRAVENOUS
  Administered 2023-06-30 (×2): 25 ug via INTRAVENOUS

## 2023-06-30 MED ORDER — PHENYLEPHRINE HCL-NACL 20-0.9 MG/250ML-% IV SOLN
INTRAVENOUS | Status: AC
  Filled 2023-06-30: qty 250

## 2023-06-30 MED ORDER — LIDOCAINE HCL (PF) 2 % IJ SOLN
INTRAMUSCULAR | Status: AC
Start: 2023-06-30 — End: ?
  Filled 2023-06-30: qty 5

## 2023-06-30 MED ORDER — BUPIVACAINE-EPINEPHRINE (PF) 0.25% -1:200000 IJ SOLN
INTRAMUSCULAR | Status: DC | PRN
  Administered 2023-06-30: 50 mL

## 2023-06-30 MED ORDER — FENTANYL CITRATE (PF) 100 MCG/2ML IJ SOLN
INTRAMUSCULAR | Status: AC
  Filled 2023-06-30: qty 2

## 2023-06-30 MED ORDER — CEFAZOLIN SODIUM-DEXTROSE 2-4 GM/100ML-% IV SOLN
INTRAVENOUS | Status: AC
  Filled 2023-06-30: qty 100

## 2023-06-30 MED ORDER — LORAZEPAM 2 MG/ML IJ SOLN
0.5000 mg | Freq: Once | INTRAMUSCULAR | Status: AC
  Administered 2023-06-30: 0.5 mg via INTRAVENOUS
  Filled 2023-06-30: qty 1

## 2023-06-30 MED ORDER — ONDANSETRON HCL 4 MG/2ML IJ SOLN
INTRAMUSCULAR | Status: DC | PRN
  Administered 2023-06-30: 4 mg via INTRAVENOUS

## 2023-06-30 MED ORDER — PROPOFOL 10 MG/ML IV BOLUS
INTRAVENOUS | Status: AC
  Filled 2023-06-30: qty 20

## 2023-06-30 MED ORDER — ROCURONIUM BROMIDE 100 MG/10ML IV SOLN
INTRAVENOUS | Status: DC | PRN
  Administered 2023-06-30: 30 mg via INTRAVENOUS
  Administered 2023-06-30: 50 mg via INTRAVENOUS
  Administered 2023-06-30: 20 mg via INTRAVENOUS
  Administered 2023-06-30: 5 mg via INTRAVENOUS
  Administered 2023-06-30 (×2): 10 mg via INTRAVENOUS

## 2023-06-30 MED ORDER — ACETAMINOPHEN 10 MG/ML IV SOLN
INTRAVENOUS | Status: AC
  Filled 2023-06-30: qty 100

## 2023-06-30 MED ORDER — PHENYLEPHRINE 80 MCG/ML (10ML) SYRINGE FOR IV PUSH (FOR BLOOD PRESSURE SUPPORT)
PREFILLED_SYRINGE | INTRAVENOUS | Status: DC | PRN
  Administered 2023-06-30 (×3): 160 ug via INTRAVENOUS

## 2023-06-30 MED ORDER — EPHEDRINE SULFATE-NACL 50-0.9 MG/10ML-% IV SOSY
PREFILLED_SYRINGE | INTRAVENOUS | Status: DC | PRN
  Administered 2023-06-30: 5 mg via INTRAVENOUS

## 2023-06-30 MED ORDER — BUPIVACAINE LIPOSOME 1.3 % IJ SUSP
INTRAMUSCULAR | Status: AC
  Filled 2023-06-30: qty 20

## 2023-06-30 MED ORDER — LACTATED RINGERS IV SOLN: INTRAVENOUS | Status: AC

## 2023-06-30 MED ORDER — IOHEXOL 300 MG/ML  SOLN
100.0000 mL | Freq: Once | INTRAMUSCULAR | Status: AC | PRN
  Administered 2023-06-30: 100 mL via INTRAVENOUS

## 2023-06-30 MED ORDER — SODIUM CHLORIDE 0.9% FLUSH
3.0000 mL | Freq: Two times a day (BID) | INTRAVENOUS | Status: DC
  Administered 2023-07-01 – 2023-07-04 (×7): 3 mL via INTRAVENOUS

## 2023-06-30 MED ORDER — INSULIN ASPART 100 UNIT/ML IJ SOLN: 0.0000 [IU] | Freq: Three times a day (TID) | INTRAMUSCULAR | Status: DC

## 2023-06-30 MED ORDER — SPY AGENT GREEN - (INDOCYANINE FOR INJECTION)
INTRAMUSCULAR | Status: DC | PRN
  Administered 2023-06-30 (×2): 5 mg via INTRAVENOUS

## 2023-06-30 MED ORDER — SODIUM CHLORIDE 0.9 % IV BOLUS
500.0000 mL | Freq: Once | INTRAVENOUS | Status: AC
  Administered 2023-06-30: 500 mL via INTRAVENOUS

## 2023-06-30 SURGICAL SUPPLY — 55 items
BAG PRESSURE INF REUSE 1000 (BAG) IMPLANT
CANNULA REDUCER 12-8 DVNC XI (CANNULA) IMPLANT
COVER TIP SHEARS 8 DVNC (MISCELLANEOUS) ×3 IMPLANT
COVER WAND RF STERILE (DRAPES) ×3 IMPLANT
DERMABOND ADVANCED .7 DNX12 (GAUZE/BANDAGES/DRESSINGS) ×3 IMPLANT
DRAPE ARM DVNC X/XI (DISPOSABLE) ×9 IMPLANT
DRAPE COLUMN DVNC XI (DISPOSABLE) ×3 IMPLANT
DRSG TEGADERM 4X4.75 (GAUZE/BANDAGES/DRESSINGS) IMPLANT
ELECT REM PT RETURN 9FT ADLT (ELECTROSURGICAL) ×2 IMPLANT
ELECTRODE REM PT RTRN 9FT ADLT (ELECTROSURGICAL) ×3 IMPLANT
FORCEPS BPLR FENES DVNC XI (FORCEP) ×3 IMPLANT
GLOVE BIO SURGEON STRL SZ 6.5 (GLOVE) ×6 IMPLANT
GLOVE BIOGEL PI IND STRL 6.5 (GLOVE) ×6 IMPLANT
GOWN STRL REUS W/ TWL LRG LVL3 (GOWN DISPOSABLE) ×9 IMPLANT
IRRIGATOR SUCT 8 DISP DVNC XI (IRRIGATION / IRRIGATOR) IMPLANT
IV CATH ANGIO 12GX3 LT BLUE (NEEDLE) IMPLANT
IV NS 1000ML BAXH (IV SOLUTION) IMPLANT
KIT PINK PAD W/HEAD ARE REST (MISCELLANEOUS) ×2 IMPLANT
KIT PINK PAD W/HEAD ARM REST (MISCELLANEOUS) ×3 IMPLANT
LABEL OR SOLS (LABEL) ×3 IMPLANT
LIGASURE IMPACT 36 18CM CVD LR (INSTRUMENTS) IMPLANT
MANIFOLD NEPTUNE II (INSTRUMENTS) ×3 IMPLANT
MESH PHASIX ST 20CMX25CM (Tissue Mesh) IMPLANT
NDL DRIVE SUT CUT DVNC (INSTRUMENTS) ×3 IMPLANT
NDL HYPO 22X1.5 SAFETY MO (MISCELLANEOUS) ×3 IMPLANT
NDL INSUFFLATION 14GA 120MM (NEEDLE) ×3 IMPLANT
NEEDLE DRIVE SUT CUT DVNC (INSTRUMENTS) ×2 IMPLANT
NEEDLE HYPO 22X1.5 SAFETY MO (MISCELLANEOUS) ×2 IMPLANT
NEEDLE INSUFFLATION 14GA 120MM (NEEDLE) ×2 IMPLANT
NS IRRIG 500ML POUR BTL (IV SOLUTION) ×3 IMPLANT
OBTURATOR OPTICAL STND 8 DVNC (TROCAR) ×2 IMPLANT
OBTURATOR OPTICALSTD 8 DVNC (TROCAR) ×3 IMPLANT
PACK LAP CHOLECYSTECTOMY (MISCELLANEOUS) ×3 IMPLANT
RELOAD PROXIMATE 75MM BLUE (ENDOMECHANICALS) ×6 IMPLANT
RELOAD STAPLE 75 3.8 BLU REG (ENDOMECHANICALS) IMPLANT
RETRACTOR WOUND ALXS 18CM SML (MISCELLANEOUS) IMPLANT
RTRCTR WOUND ALEXIS O 18CM SML (MISCELLANEOUS) ×2 IMPLANT
SCISSORS MNPLR CVD DVNC XI (INSTRUMENTS) ×3 IMPLANT
SEAL UNIV 5-12 XI (MISCELLANEOUS) ×9 IMPLANT
SET TUBE SMOKE EVAC HIGH FLOW (TUBING) ×3 IMPLANT
SOL ELECTROSURG ANTI STICK (MISCELLANEOUS) ×2 IMPLANT
SOLUTION ELECTROSURG ANTI STCK (MISCELLANEOUS) ×3 IMPLANT
SPONGE T-LAP 18X18 ~~LOC~~+RFID (SPONGE) IMPLANT
STAPLER PROXIMATE 75MM BLUE (STAPLE) IMPLANT
STAPLER SKIN PROX 35W (STAPLE) IMPLANT
SUT MNCRL 4-0 27 PS-2 XMFL (SUTURE) ×2 IMPLANT
SUT STRATA 2-0 30 CT-2 (SUTURE) ×6 IMPLANT
SUT STRATAFIX PDS 2-0 CT-2 (SUTURE) IMPLANT
SUT STRATAFIX PDS 30 CT-1 (SUTURE) ×3 IMPLANT
SUT VIC AB 3-0 SH 27X BRD (SUTURE) IMPLANT
SUT VICRYL 0 UR6 27IN ABS (SUTURE) ×3 IMPLANT
SUTURE MNCRL 4-0 27XMF (SUTURE) ×3 IMPLANT
TAPE TRANSPORE STRL 2 31045 (GAUZE/BANDAGES/DRESSINGS) ×3 IMPLANT
TRAP FLUID SMOKE EVACUATOR (MISCELLANEOUS) ×3 IMPLANT
WATER STERILE IRR 500ML POUR (IV SOLUTION) ×3 IMPLANT

## 2023-06-30 NOTE — Anesthesia Procedure Notes (Deleted)
 Procedure Name: Intubation Date/Time: 06/30/2023 7:32 PM  Performed by: Karoline Caldwell, CRNAPre-anesthesia Checklist: Patient identified, Patient being monitored, Timeout performed, Emergency Drugs available and Suction available Patient Re-evaluated:Patient Re-evaluated prior to induction Oxygen Delivery Method: Circle system utilized Preoxygenation: Pre-oxygenation with 100% oxygen Induction Type: IV induction and Rapid sequence Ventilation: Mask ventilation without difficulty Laryngoscope Size: 4 and McGraph Grade View: Grade I Tube type: Oral Tube size: 7.0 mm Number of attempts: 1 Airway Equipment and Method: Stylet Placement Confirmation: ETT inserted through vocal cords under direct vision, positive ETCO2 and breath sounds checked- equal and bilateral Secured at: 18 cm Tube secured with: Tape Dental Injury: Teeth and Oropharynx as per pre-operative assessment

## 2023-06-30 NOTE — Anesthesia Preprocedure Evaluation (Addendum)
 Anesthesia Evaluation  Patient identified by MRN, date of birth, ID band Patient awake    Reviewed: Allergy & Precautions, NPO status , Patient's Chart, lab work & pertinent test results  Airway Mallampati: III  TM Distance: >3 FB Neck ROM: full    Dental  (+) Chipped, Dental Advidsory Given   Pulmonary former smoker   Pulmonary exam normal        Cardiovascular hypertension, On Medications + CAD and +CHF  Normal cardiovascular exam+ dysrhythmias      Neuro/Psych negative neurological ROS  negative psych ROS   GI/Hepatic negative GI ROS, Neg liver ROS,,,  Endo/Other  diabetes    Renal/GU      Musculoskeletal   Abdominal   Peds  Hematology negative hematology ROS (+)   Anesthesia Other Findings TEE results from 04/16/23:  IMPRESSIONS     1. Left ventricular ejection fraction, by estimation, is 25 to 30%. The  left ventricle has severely decreased function. The left ventricle  demonstrates global hypokinesis. Left ventricular diastolic parameters are  indeterminate.   2. Right ventricular systolic function is moderately reduced. The right  ventricular size is mildly enlarged.   3. Left atrial size was moderately dilated.   4. Right atrial size was moderately dilated.   5. The mitral valve is normal in structure. Mild mitral valve  regurgitation. No evidence of mitral stenosis.   6. Tricuspid valve regurgitation is moderate.   7. The aortic valve was not well visualized. There is mild calcification  of the aortic valve. Aortic valve regurgitation is not visualized. No  aortic stenosis is present.   8. The inferior vena cava is normal in size with <50% respiratory  variability, suggesting right atrial pressure of 8 mmHg.   FINDINGS   Left Ventricle: Left ventricular ejection fraction, by estimation, is 25  to 30%. The left ventricle has severely decreased function. The left  ventricle demonstrates global  hypokinesis. The left ventricular internal  cavity size was normal in size. There  is no left ventricular hypertrophy. Abnormal (paradoxical) septal motion,  consistent with left bundle branch block. Left ventricular diastolic  parameters are indeterminate.   Right Ventricle: The right ventricular size is mildly enlarged. No  increase in right ventricular wall thickness. Right ventricular systolic  function is moderately reduced.   Left Atrium: Left atrial size was moderately dilated.   Right Atrium: Right atrial size was moderately dilated.   Pericardium: There is no evidence of pericardial effusion.   Mitral Valve: The mitral valve is normal in structure. Mild mitral valve  regurgitation. No evidence of mitral valve stenosis.   Tricuspid Valve: The tricuspid valve is normal in structure. Tricuspid  valve regurgitation is moderate . No evidence of tricuspid stenosis.   Aortic Valve: The aortic valve was not well visualized. There is mild  calcification of the aortic valve. Aortic valve regurgitation is not  visualized. No aortic stenosis is present.   Pulmonic Valve: The pulmonic valve was normal in structure. Pulmonic valve  regurgitation is not visualized. No evidence of pulmonic stenosis.   Aorta: The aortic root is normal in size and structure.   Venous: The inferior vena cava is normal in size with less than 50%  respiratory variability, suggesting right atrial pressure of 8 mmHg.   IAS/Shunts: No atrial level shunt detected by color flow Doppler.     Past Medical History: No date: Arthritis No date: Cancer [facility])     Comment:  Endometrial No date: Cataract No date: CHF (  congestive heart failure) (HCC) No date: Coronary artery disease No date: Diabetes mellitus without complication (HCC) No date: Hyperlipidemia No date: Pulmonary hypertension (HCC) No date: Rheumatoid arthritis (HCC)  Past Surgical History: No date: ABDOMINAL HYSTERECTOMY No date:  ADRENALECTOMY No date: APPENDECTOMY 06/07/2023: RIGHT/LEFT HEART CATH AND CORONARY ANGIOGRAPHY; N/A     Comment:  Procedure: RIGHT/LEFT HEART CATH AND CORONARY               ANGIOGRAPHY;  Surgeon: Laurey Morale, MD;  Location:               [facility];  Service: Cardiovascular;                Laterality: N/A;  BMI    Body Mass Index: 29.88 kg/m      Reproductive/Obstetrics negative OB ROS                             Anesthesia Physical Anesthesia Plan  ASA: 4 and emergent  Anesthesia Plan: General ETT   Post-op Pain Management:    Induction: Rapid sequence, Intravenous and Cricoid pressure planned  PONV Risk Score and Plan: 3 and Ondansetron and Dexamethasone  Airway Management Planned: Oral ETT  Additional Equipment: Arterial line  Intra-op Plan:   Post-operative Plan: Possible Post-op intubation/ventilation  Informed Consent: I have reviewed the patients History and Physical, chart, labs and discussed the procedure including the risks, benefits and alternatives for the proposed anesthesia with the patient or authorized representative who has indicated his/her understanding and acceptance.     Dental Advisory Given  Plan Discussed with: Anesthesiologist, CRNA and Surgeon  Anesthesia Plan Comments: (Patient consented for risks of anesthesia including but not limited to:  - adverse reactions to medications - damage to eyes, teeth, lips or other oral mucosa - nerve damage due to positioning  - sore throat or hoarseness - Damage to heart, brain, nerves, lungs, other parts of body or loss of life  Explained to patient that she is at a very high risk of complications under anesthesia due to her heart. Patient voiced understanding and assent.)       Anesthesia Quick Evaluation

## 2023-06-30 NOTE — Assessment & Plan Note (Addendum)
 Currently managed with Orencia and leflunomide.   - Hold home regimen at this time

## 2023-06-30 NOTE — Anesthesia Procedure Notes (Addendum)
 Procedure Name: Intubation Date/Time: 06/30/2023 7:32 PM  Performed by: Karoline Caldwell, CRNAPre-anesthesia Checklist: Patient identified, Patient being monitored, Timeout performed, Emergency Drugs available and Suction available Patient Re-evaluated:Patient Re-evaluated prior to induction Oxygen Delivery Method: Circle system utilized Preoxygenation: Pre-oxygenation with 100% oxygen Induction Type: IV induction and Rapid sequence Laryngoscope Size: McGrath and 3 Grade View: Grade I Tube type: Oral Tube size: 6.5 mm Number of attempts: 1 Airway Equipment and Method: Stylet Placement Confirmation: ETT inserted through vocal cords under direct vision, positive ETCO2 and breath sounds checked- equal and bilateral Secured at: 18 cm Tube secured with: Tape Dental Injury: Teeth and Oropharynx as per pre-operative assessment

## 2023-06-30 NOTE — Assessment & Plan Note (Addendum)
 Recently diagnosed HFrEF with last EF of 25-30%, currently established with heart failure clinic followed by Dr. Shirlee Latch.  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  Patient appears hypovolemic, in the setting of vomiting and SBO.  - Telemetry monitoring - Hold home GDMT and diuretics - Daily weights - Obtain BNP and trend

## 2023-06-30 NOTE — ED Triage Notes (Signed)
 Pt to ED [facility] from home for lower abd cramping x3 days with emesis. Reports constipation.

## 2023-06-30 NOTE — H&P (Signed)
 History and Physical    Patient: Tammy Ashley:[id number] DOB: 21-Mar-1946 DOA: 06/30/2023 DOS: the patient was seen and examined on 06/30/2023 PCP: Adrian Prince, MD  Patient coming from: Home  Chief Complaint:  Chief Complaint  Patient presents with   Abdominal Pain   HPI: Tammy Ashley is a 78 y.o. female with medical history significant of HFpEF with last EF of 25-30%, obstructive CAD, pheochromocytoma s/p right adrenalectomy, rheumatoid arthritis, type 2 diabetes, mild pulmonary hypertension, endometrial cancer (2010), hyperlipidemia, who presents to the ED due to abdominal pain.  Tammy Ashley states that she has been experiencing at least 4-day history of persistent nausea with vomiting and abdominal pain.  She states that initially, she vomited anything she was trying to eat or drink however she has been mostly dry heaving for the last 2 days that she has not taken anything by mouth.  She has not had a bowel movement in 4 days.  Abdominal pain is nonradiating.  She denies any chest pain, shortness of breath, lower extremity swelling or palpitations.  Per chart review, patient has extensive history of abdominal surgery including adrenalectomy, appendectomy, hysterectomy, laparoscopic lymph node dissection.   ED course: On arrival to the ED, patient was hypotensive at 93/59 with heart rate of 104.  She was saturating at 95% on room air.  She was afebrile at 97.8.  Initial workup notable for hemoglobin of 16, glucose of 169, creatinine 0.59 with GFR above 60.  Urinalysis with glucosuria, ketonuria, proteinuria and elevated specific gravity.  CT of the abdomen obtained with findings concerning for SBO versus enteritis with complex ventral abdominal wall hernia.  General surgery was consulted with recommendations to place NG tube.  TR[facility] contacted for admission.  Review of Systems: As mentioned in the history of present illness. All other systems reviewed and are  negative.  Past Medical History:  Diagnosis Date   Arthritis    Cancer [facility])    Cataract    Past Surgical History:  Procedure Laterality Date   RIGHT/LEFT HEART CATH AND CORONARY ANGIOGRAPHY N/A 06/07/2023   Procedure: RIGHT/LEFT HEART CATH AND CORONARY ANGIOGRAPHY;  Surgeon: Laurey Morale, MD;  Location: [facility] INVASIVE CV LAB;  Service: Cardiovascular;  Laterality: N/A;   Social History:  reports that she has quit smoking. She has never used smokeless tobacco. She reports that she does not currently use alcohol. No history on file for drug use.  Allergies  Allergen Reactions   Hydrocodone-Acetaminophen Hives   Oxycodone-Acetaminophen Hives   Aspirin Rash    Other reaction(s): Unknown    History reviewed. No pertinent family history.  Prior to Admission medications   Medication Sig Start Date End Date Taking? Authorizing Provider  Abatacept (ORENCIA) 125 MG/ML SOSY Inject 125 mg into the skin every 30 (thirty) days.    [provider]  acetaminophen (TYLENOL) 650 MG CR tablet Take 1,300 mg by mouth every morning.    [provider]  atorvastatin (LIPITOR) 80 MG tablet Take 1 tablet (80 mg total) by mouth daily. 06/15/23   Laurey Morale, MD  clopidogrel (PLAVIX) 75 MG tablet Take 1 tablet (75 mg total) by mouth daily. 06/07/23 06/06/24  Laurey Morale, MD  CONTOUR NEXT TEST test strip USE TO SELF MONITOR BLOOD GLUCOSE DAILY DX E11.9 05/13/19   [provider]  cycloSPORINE (RESTASIS) 0.05 % ophthalmic emulsion Place 1 drop into both eyes 2 (two) times daily.  06/07/16   [provider]  diphenhydramine-acetaminophen (TYLENOL PM) 25-500  MG TABS tablet Take 1-2 tablets by mouth at bedtime.    [provider]  ergocalciferol (VITAMIN D2) 50000 units capsule Take 50,000 Units by mouth once a week.    Adrian Prince, MD  FARXIGA 10 MG TABS tablet Take 1 tablet (10 mg total) by mouth daily before breakfast. 05/22/23   Laurey Morale, MD   furosemide (LASIX) 40 MG tablet Take 20 mg by mouth as needed for weight gain or shortness of breath Patient not taking: Reported on 06/20/2023 06/14/23   Laurey Morale, MD  leflunomide (ARAVA) 10 MG tablet Take 10 mg by mouth daily. 12/19/11   Rossie Muskrat, MD  metFORMIN (GLUCOPHAGE-XR) 500 MG 24 hr tablet Take 500 mg by mouth 2 (two) times daily. 05/20/15   Adrian Prince, MD  metoprolol succinate (TOPROL XL) 25 MG 24 hr tablet Take 0.5 tablets (12.5 mg total) by mouth at bedtime. 06/20/23   Laurey Morale, MD  Multiple Vitamins-Minerals (MULTIVITAMIN WITH MINERALS) tablet Take 1 tablet by mouth daily.    [provider]  sacubitril-valsartan (ENTRESTO) 24-26 MG Take 1 tablet by mouth 2 (two) times daily. 05/22/23   Laurey Morale, MD  spironolactone (ALDACTONE) 25 MG tablet Take 0.5 tablets (12.5 mg total) by mouth daily. 06/20/23 09/18/23  Laurey Morale, MD    Physical Exam: Vitals:   06/30/23 1313 06/30/23 1314 06/30/23 1316  BP: (!) 93/59    Pulse: (!) 104    Resp: 20    Temp: 97.8 F (36.6 C)    SpO2:   95%  Weight:  69.4 kg   Height:  5' (1.524 m)    Physical Exam Vitals and nursing note reviewed.  Constitutional:      General: She is not in acute distress.    Appearance: She is obese.  HENT:     Head: Normocephalic and atraumatic.     Mouth/Throat:     Mouth: Mucous membranes are dry.     Pharynx: Oropharynx is clear.  Eyes:     Conjunctiva/sclera: Conjunctivae normal.     Pupils: Pupils are equal, round, and reactive to light.  Cardiovascular:     Rate and Rhythm: Regular rhythm. Tachycardia present.     Heart sounds: No murmur heard. Pulmonary:     Effort: Pulmonary effort is normal. No respiratory distress.     Breath sounds: Normal breath sounds. No wheezing, rhonchi or rales.  Abdominal:     General: Bowel sounds are absent. There is no distension.     Tenderness: There is abdominal tenderness.     Hernia: A hernia is present.  Musculoskeletal:      Right lower leg: No edema.     Left lower leg: No edema.  Skin:    General: Skin is warm and dry.  Neurological:     General: No focal deficit present.     Mental Status: She is alert and oriented to person, place, and time.  Psychiatric:        Mood and Affect: Mood normal.        Behavior: Behavior normal.    Data Reviewed: CBC with WBC of 8.2, hemoglobin of 16.0, platelets of 337 CMP with sodium of 137, potassium 4.6, bicarb 23, glucose 169, creatinine 0.59, AST 23, ALT 10, GFR above 60  Last BNP 540 on 06/20/2023  CT ABDOMEN PELVIS W CONTRAST Result Date: 06/30/2023 CLINICAL DATA:  Lower abdominal pain. Right upper quadrant pain. Nausea and vomiting. EXAM: CT ABDOMEN AND  PELVIS WITH CONTRAST TECHNIQUE: Multidetector CT imaging of the abdomen and pelvis was performed using the standard protocol following bolus administration of intravenous contrast. RADIATION DOSE REDUCTION: This exam was performed according to the departmental dose-optimization program which includes automated exposure control, adjustment of the mA and/or kV according to patient size and/or use of iterative reconstruction technique. CONTRAST:  OMNIPAQUE IOHEXOL 300 MG/ML  SOLN COMPARISON:  Noncontrast CT 08/11/2016 FINDINGS: Lower chest: Upper normal heart size. Wall thickening of the distal esophagus. No basilar airspace disease or pleural effusion. Hepatobiliary: Stable cyst in the left lobe of the liver. No suspicious liver lesion. Gallbladder physiologically distended, no calcified stone. No biliary dilatation. Pancreas: Unremarkable. No pancreatic ductal dilatation or surrounding inflammatory changes. Spleen: Normal in size without focal abnormality. Adrenals/Urinary Tract: Stable left adrenal thickening. Stable right adrenal calcifications. No further follow-up imaging is recommended. No hydronephrosis. Punctate nonobstructing stone in the upper pole of the left kidney. No suspicious renal lesion. Right renal cyst.  No further follow-up imaging is recommended. Unremarkable urinary bladder. Stomach/Bowel: Wall thickening of the distal esophagus. Mild fluid-filled stomach without abnormal distension. Dilated fluid-filled small bowel with bowel wall thickening and mesenteric edema. Short segment of small bowel extends into a ventral abdominal wall hernia just to the left of midline, however the bowel both proximal and distal to this appears inflamed. There is general transition from dilated and inflamed to nondilated in the right mid abdomen without discrete transition point. Appendectomy. Colonic diverticulosis, prominent in the left colon, without diverticulitis. Vascular/Lymphatic: Aortic and branch atherosclerosis. No aortic aneurysm. The portal vein is patent. No abdominopelvic adenopathy. Reproductive: Status post hysterectomy. No adnexal masses. Other: Complex ventral abdominal wall hernia. Left paraumbilical component contains a short segment of small bowel. There is adjacent fat stranding and inflammation within the hernia sac as well as adjacent subcutaneous tissues. Smaller portions of the hernia contains only fat. A supraumbilical component of the hernia contains anti mesenteric border of transverse colon but no associated inflammation. There is a separate upper abdominal ventral abdominal wall hernia containing only fat. Small amount of mesenteric free fluid and free fluid in the pelvis. No free air. Musculoskeletal: Scoliosis and degenerative change in the spine. Advanced right hip arthropathy. There are no acute or suspicious osseous abnormalities. IMPRESSION: 1. Dilated fluid-filled small bowel with bowel wall thickening and mesenteric inflammation. There is a short segment of small bowel extending into a ventral abdominal wall hernia, however the bowel both proximal and distal to the hernia appear inflamed. There is general transition to nondilated small bowel distally. Findings may represent early small bowel  obstruction or enteritis/inflammation. 2. Complex ventral abdominal wall hernia, component containing short segment of small bowel. Portion of the hernia also contains anti mesenteric border of transverse colon. There is fat stranding within and adjacent to the hernia likely related small bowel inflammation. 3. Wall thickening of the distal esophagus, query reflux. 4. Nonobstructing left renal stone. 5. Colonic diverticulosis without focal diverticulitis. Aortic Atherosclerosis (ICD10-I70.0). Electronically Signed   By: Narda Rutherford M.D.   On: 06/30/2023 16:07   Results are pending, will review when available.  Assessment and Plan:  * SBO (small bowel obstruction) (HCC) Patient is presenting with several day history of intractable nausea/vomiting with mild abdominal pain with findings concerning for SBO versus inflammation.  Given multiple previous abdominal surgeries and complicated ventral hernia, general surgery suspects this may be an SBO with recommendation to pursue surgery.  - General Surgery consulted; appreciate their recommendations - Robotic assisted  laparoscopic incisional hernia repair this evening - N.p.o. - Continue NG tube at low intermittent suction - Gentle IV fluid resuscitation - Monitor potassium and magnesium daily  Chronic HFrEF (heart failure with reduced ejection fraction) (HCC) Recently diagnosed HFrEF with last EF of 25-30%, currently established with heart failure clinic followed by Dr. Shirlee Latch.  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  Patient appears hypovolemic, in the setting of vomiting and SBO.  - Telemetry monitoring - Hold home GDMT and diuretics - Daily weights - Obtain BNP and trend  Essential hypertension Previous history of hypertension, with chronic hypotension after being started on GDMT for HFrEF.  Per cardiology note, SBP generally between 90-100 now.  Blood pressure currently within that range.  - Hold home regimen given n.p.o. with  NG tube  Type 2 diabetes mellitus (HCC) History of type 2 diabetes with no recent A1c available in chart. Currently managed with Farxiga and metformin.  - SSI, sensitive - A1c pending - Hold home regimen  Coronary artery disease Recent left heart cath with chronically occluded LAD, not amenable to PCI or CABG.  - Resume home Plavix postoperatively when cleared by surgery - Hold home atorvastatin pending bowel function return  Arthritis, rheumatoid (HCC) Currently managed with Orencia and leflunomide.   - Hold home regimen at this time  Advance Care Planning:   Code Status: Full Code verified by patient  Consults: General Surgery  Family Communication: Patient's son updated at bedside  Severity of Illness: The appropriate patient status for this patient is INPATIENT. Inpatient status is judged to be reasonable and necessary in order to provide the required intensity of service to ensure the patient's safety. The patient's presenting symptoms, physical exam findings, and initial radiographic and laboratory data in the context of their chronic comorbidities is felt to place them at high risk for further clinical deterioration. Furthermore, it is not anticipated that the patient will be medically stable for discharge from the hospital within 2 midnights of admission.   * I certify that at the point of admission it is my clinical judgment that the patient will require inpatient hospital care spanning beyond 2 midnights from the point of admission due to high intensity of service, high risk for further deterioration and high frequency of surveillance required.*  Author: Verdene Lennert, MD 06/30/2023 5:40 PM  For on call review [email or web address].

## 2023-06-30 NOTE — Assessment & Plan Note (Addendum)
 Previous history of hypertension, with chronic hypotension after being started on GDMT for HFrEF.  Per cardiology note, SBP generally between 90-100 now.  Blood pressure currently within that range.  - Hold home regimen given n.p.o. with NG tube

## 2023-06-30 NOTE — Assessment & Plan Note (Addendum)
 Patient is presenting with several day history of intractable nausea/vomiting with mild abdominal pain with findings concerning for SBO versus inflammation.  Given multiple previous abdominal surgeries and complicated ventral hernia, general surgery suspects this may be an SBO with recommendation to pursue surgery.  - General Surgery consulted; appreciate their recommendations - Robotic assisted laparoscopic incisional hernia repair this evening - N.p.o. - Continue NG tube at low intermittent suction - Gentle IV fluid resuscitation - Monitor potassium and magnesium daily

## 2023-06-30 NOTE — Assessment & Plan Note (Signed)
 History of type 2 diabetes with no recent A1c available in chart. Currently managed with Farxiga and metformin.  - SSI, sensitive - A1c pending - Hold home regimen

## 2023-06-30 NOTE — Anesthesia Procedure Notes (Addendum)
 Arterial Line Insertion Start/End2/15/2025 7:20 AM, 06/30/2023 7:30 AM Performed by: Stephanie Coup, MD, anesthesiologist  Patient location: OR. Preanesthetic checklist: patient identified, IV checked, site marked, risks and benefits discussed, surgical consent, monitors and equipment checked, pre-op evaluation, timeout performed and anesthesia consent Lidocaine 1% used for infiltration Left, radial was placed Catheter size: 20 G Hand hygiene performed  and maximum sterile barriers used   Attempts: 1 Procedure performed using ultrasound guided technique. Following insertion, dressing applied and Biopatch. Post procedure assessment: normal and unchanged  Patient tolerated the procedure well with no immediate complications.

## 2023-06-30 NOTE — Consult Note (Signed)
 SURGICAL CONSULTATION NOTE   HISTORY OF PRESENT ILLNESS (HPI):  78 y.o. female presented to [facility] ED for evaluation of abdominal pain, nausea and vomiting. Patient reports she has been having abdominal pain, nausea and vomiting for the last 3 to 4 days.  She endorses that the pain is generalized.  Pain does not radiate to other part of the body.  Denies any chest pain.  Patient cannot identify any alleviating or aggravating factors.  Patient has not been able to have any bowel movements in the last 4 days.  She also endorses having significant nausea and vomiting in the last 4 days.  At the ED she was found with stable vital signs, no fever.  Abdominal exam with generalized tenderness on palpation with nonreducible incisional hernia.  No skin changes.  Labs showed no leukocytosis.  No significant electrolyte disturbance.  Normal lipase.  Normal hemoglobin.  She had a CT scan of the abdomen and pelvis that shows dilated small bowel within the midline hernia.  No free air or free fluid.  Significant stranding surrounding the small bowel around the hernia  Surgery is consulted by Dr. Anner Crete in this context for evaluation and management of small bowel obstruction.  PAST MEDICAL HISTORY (PMH):  Past Medical History:  Diagnosis Date   Arthritis    Cancer (HCC)    Cataract      PAST SURGICAL HISTORY (PSH):  Past Surgical History:  Procedure Laterality Date   RIGHT/LEFT HEART CATH AND CORONARY ANGIOGRAPHY N/A 06/07/2023   Procedure: RIGHT/LEFT HEART CATH AND CORONARY ANGIOGRAPHY;  Surgeon: Laurey Morale, MD;  Location: [facility] INVASIVE CV LAB;  Service: Cardiovascular;  Laterality: N/A;     MEDICATIONS:  Prior to Admission medications   Medication Sig Start Date End Date Taking? Authorizing Provider  Abatacept (ORENCIA) 125 MG/ML SOSY Inject 125 mg into the skin every 30 (thirty) days.    [provider]  acetaminophen (TYLENOL) 650 MG CR tablet Take 1,300 mg by mouth every morning.     [provider]  atorvastatin (LIPITOR) 80 MG tablet Take 1 tablet (80 mg total) by mouth daily. 06/15/23   Laurey Morale, MD  clopidogrel (PLAVIX) 75 MG tablet Take 1 tablet (75 mg total) by mouth daily. 06/07/23 06/06/24  Laurey Morale, MD  CONTOUR NEXT TEST test strip USE TO SELF MONITOR BLOOD GLUCOSE DAILY DX E11.9 05/13/19   [provider]  cycloSPORINE (RESTASIS) 0.05 % ophthalmic emulsion Place 1 drop into both eyes 2 (two) times daily.  06/07/16   [provider]  diphenhydramine-acetaminophen (TYLENOL PM) 25-500 MG TABS tablet Take 1-2 tablets by mouth at bedtime.    [provider]  ergocalciferol (VITAMIN D2) 50000 units capsule Take 50,000 Units by mouth once a week.    Adrian Prince, MD  FARXIGA 10 MG TABS tablet Take 1 tablet (10 mg total) by mouth daily before breakfast. 05/22/23   Laurey Morale, MD  furosemide (LASIX) 40 MG tablet Take 20 mg by mouth as needed for weight gain or shortness of breath Patient not taking: Reported on 06/20/2023 06/14/23   Laurey Morale, MD  leflunomide (ARAVA) 10 MG tablet Take 10 mg by mouth daily. 12/19/11   Rossie Muskrat, MD  metFORMIN (GLUCOPHAGE-XR) 500 MG 24 hr tablet Take 500 mg by mouth 2 (two) times daily. 05/20/15   Adrian Prince, MD  metoprolol succinate (TOPROL XL) 25 MG 24 hr tablet Take 0.5 tablets (12.5 mg total) by mouth at bedtime. 06/20/23  Laurey Morale, MD  Multiple Vitamins-Minerals (MULTIVITAMIN WITH MINERALS) tablet Take 1 tablet by mouth daily.    [provider]  sacubitril-valsartan (ENTRESTO) 24-26 MG Take 1 tablet by mouth 2 (two) times daily. 05/22/23   Laurey Morale, MD  spironolactone (ALDACTONE) 25 MG tablet Take 0.5 tablets (12.5 mg total) by mouth daily. 06/20/23 09/18/23  Laurey Morale, MD     ALLERGIES:  Allergies  Allergen Reactions   Hydrocodone-Acetaminophen Hives   Oxycodone-Acetaminophen Hives   Aspirin Rash    Other reaction(s): Unknown     SOCIAL  HISTORY:  Social History   Socioeconomic History   Marital status: Married    Spouse name: Not on file   Number of children: Not on file   Years of education: Not on file   Highest education level: Not on file  Occupational History   Not on file  Tobacco Use   Smoking status: Former   Smokeless tobacco: Never  Substance and Sexual Activity   Alcohol use: Not Currently   Drug use: Not on file   Sexual activity: Not on file  Other Topics Concern   Not on file  Social History Narrative   Not on file   Social Drivers of Health   Financial Resource Strain: Medium Risk (05/25/2023)   Overall Financial Resource Strain (CARDIA)    Difficulty of Paying Living Expenses: Somewhat hard  Food Insecurity: Not on file  Transportation Needs: Not on file  Physical Activity: Not on file  Stress: Not on file  Social Connections: Not on file  Intimate Partner Violence: Not on file      FAMILY HISTORY:  History reviewed. No pertinent family history.   REVIEW OF SYSTEMS:  Constitutional: denies weight loss, fever, chills, or sweats  Eyes: denies any other vision changes, history of eye injury  ENT: denies sore throat, hearing problems  Respiratory: denies shortness of breath, wheezing  Cardiovascular: denies chest pain, palpitations  Gastrointestinal: positive abdominal pain, nausea and vomiting Genitourinary: denies burning with urination or urinary frequency Musculoskeletal: denies any other joint pains or cramps  Skin: denies any other rashes or skin discolorations  Neurological: denies any other headache, dizziness, weakness  Psychiatric: denies any other depression, anxiety   All other review of systems were negative   VITAL SIGNS:  Temp:  [97.8 F (36.6 C)] 97.8 F (36.6 C) (02/15 1313) Pulse Rate:  [104] 104 (02/15 1313) Resp:  [20] 20 (02/15 1313) BP: (93)/(59) 93/59 (02/15 1313) SpO2:  [95 %] 95 % (02/15 1316) Weight:  [69.4 kg] 69.4 kg (02/15 1314)     Height: 5'  (152.4 cm) Weight: 69.4 kg BMI (Calculated): 29.88   INTAKE/OUTPUT:  This shift: Total I/O In: 500 [IV Piggyback:500] Out: -   Last 2 shifts: @IOLAST2SHIFTS @   PHYSICAL EXAM:  Constitutional:  -- Normal body habitus  -- Awake, alert, and oriented x3  Eyes:  -- Pupils equally round and reactive to light  -- No scleral icterus  Ear, nose, and throat:  -- No jugular venous distension  Pulmonary:  -- No crackles  -- Equal breath sounds bilaterally -- Breathing non-labored at rest Cardiovascular:  -- S1, S2 present  -- No pericardial rubs Gastrointestinal:  -- Abdomen soft, tender palpation in the midline over the hernia.  Hernia not reducible. distended, no guarding or rebound tenderness -- No abdominal masses appreciated, pulsatile or otherwise  Musculoskeletal and Integumentary:  -- Wounds: None appreciated -- Extremities: B/L UE and LE FROM, hands and  feet warm, no edema  Neurologic:  -- Motor function: intact and symmetric -- Sensation: intact and symmetric   Labs:     Latest Ref Rng & Units 06/30/2023    1:16 PM 06/07/2023    8:12 AM 06/07/2023    6:19 AM  CBC  WBC 4.0 - 10.5 K/uL 8.2   5.5   Hemoglobin 12.0 - 15.0 g/dL 16.1  09.6    04.5  40.9   Hematocrit 36.0 - 46.0 % 49.3  44.0    45.0  45.9   Platelets 150 - 400 K/uL 337   344       Latest Ref Rng & Units 06/30/2023    1:16 PM 06/20/2023    3:43 PM 06/07/2023    8:12 AM  CMP  Glucose 70 - 99 mg/dL 811  914    BUN 8 - 23 mg/dL 21  14    Creatinine 7.82 - 1.00 mg/dL 9.56  2.13    Sodium 086 - 145 mmol/L 137  142  139    144   Potassium 3.5 - 5.1 mmol/L 4.6  4.3  3.6    3.7   Chloride 98 - 111 mmol/L 100  101    CO2 22 - 32 mmol/L 23  24    Calcium 8.9 - 10.3 mg/dL 9.2  9.5    Total Protein 6.5 - 8.1 g/dL 6.8     Total Bilirubin 0.0 - 1.2 mg/dL 1.7     Alkaline Phos 38 - 126 U/L 41     AST 15 - 41 U/L 23     ALT 0 - 44 U/L 10       Imaging studies:  EXAM: CT ABDOMEN AND PELVIS WITH CONTRAST    TECHNIQUE: Multidetector CT imaging of the abdomen and pelvis was performed using the standard protocol following bolus administration of intravenous contrast.   RADIATION DOSE REDUCTION: This exam was performed according to the departmental dose-optimization program which includes automated exposure control, adjustment of the mA and/or kV according to patient size and/or use of iterative reconstruction technique.   CONTRAST:  OMNIPAQUE IOHEXOL 300 MG/ML  SOLN   COMPARISON:  Noncontrast CT 08/11/2016   FINDINGS: Lower chest: Upper normal heart size. Wall thickening of the distal esophagus. No basilar airspace disease or pleural effusion.   Hepatobiliary: Stable cyst in the left lobe of the liver. No suspicious liver lesion. Gallbladder physiologically distended, no calcified stone. No biliary dilatation.   Pancreas: Unremarkable. No pancreatic ductal dilatation or surrounding inflammatory changes.   Spleen: Normal in size without focal abnormality.   Adrenals/Urinary Tract: Stable left adrenal thickening. Stable right adrenal calcifications. No further follow-up imaging is recommended. No hydronephrosis. Punctate nonobstructing stone in the upper pole of the left kidney. No suspicious renal lesion. Right renal cyst. No further follow-up imaging is recommended. Unremarkable urinary bladder.   Stomach/Bowel: Wall thickening of the distal esophagus. Mild fluid-filled stomach without abnormal distension. Dilated fluid-filled small bowel with bowel wall thickening and mesenteric edema. Short segment of small bowel extends into a ventral abdominal wall hernia just to the left of midline, however the bowel both proximal and distal to this appears inflamed. There is general transition from dilated and inflamed to nondilated in the right mid abdomen without discrete transition point. Appendectomy. Colonic diverticulosis, prominent in the left colon, without diverticulitis.    Vascular/Lymphatic: Aortic and branch atherosclerosis. No aortic aneurysm. The portal vein is patent. No abdominopelvic adenopathy.   Reproductive: Status post hysterectomy.  No adnexal masses.   Other: Complex ventral abdominal wall hernia. Left paraumbilical component contains a short segment of small bowel. There is adjacent fat stranding and inflammation within the hernia sac as well as adjacent subcutaneous tissues. Smaller portions of the hernia contains only fat. A supraumbilical component of the hernia contains anti mesenteric border of transverse colon but no associated inflammation. There is a separate upper abdominal ventral abdominal wall hernia containing only fat. Small amount of mesenteric free fluid and free fluid in the pelvis. No free air.   Musculoskeletal: Scoliosis and degenerative change in the spine. Advanced right hip arthropathy. There are no acute or suspicious osseous abnormalities.   IMPRESSION: 1. Dilated fluid-filled small bowel with bowel wall thickening and mesenteric inflammation. There is a short segment of small bowel extending into a ventral abdominal wall hernia, however the bowel both proximal and distal to the hernia appear inflamed. There is general transition to nondilated small bowel distally. Findings may represent early small bowel obstruction or enteritis/inflammation. 2. Complex ventral abdominal wall hernia, component containing short segment of small bowel. Portion of the hernia also contains anti mesenteric border of transverse colon. There is fat stranding within and adjacent to the hernia likely related small bowel inflammation. 3. Wall thickening of the distal esophagus, query reflux. 4. Nonobstructing left renal stone. 5. Colonic diverticulosis without focal diverticulitis.   Aortic Atherosclerosis (ICD10-I70.0).     Electronically Signed   By: Narda Rutherford M.D.   On: 06/30/2023 16:07  Assessment/Plan:  78 y.o. female  with incisional hernia with small bowel obstruction, complicated by pertinent comorbidities including CHF, coronary artery disease.  Patient with incisional hernia with small bowel obstruction.  CT scan with stranding surrounding the small bowel.  Unable to reduce the hernia at bedside.  Even though there is no significant skin changes on the outside, there is tenderness to palpation in the area.  With these findings I recommend to proceed with repair of incisional hernia to avoid complication of strangulation.  Will start optimizing the patient putting NGT to decompress proximal small bowel.  Patient very high risk for surgery due to CHF and coronary artery disease.  Patient on Plavix.  Even with her high risk she will benefit of the surgery due to concern of strangulation which will cause the surgery to be an emergency and even a more risky surgery.  After surgery patient will need to continue cardiac monitoring due to high risk of cardiac complication postop.  Appreciate hospitalist admission due to her significant cardiac history.  I explained all these recommendation to the patient and she endorses she agreed to proceed with robotic assisted laparoscopic repair of incisional hernia.  She also understand the possible need of open surgery as well.  Gae Gallop, MD

## 2023-06-30 NOTE — ED Provider Notes (Signed)
 [facility] Provider Note    Event Date/Time   First MD Initiated Contact with Patient 06/30/23 1359     (approximate)   History   Abdominal Pain   HPI Tammy Ashley is a 78 y.o. female with history of DM2, HTN, HLD, HFrEF with EF 25 to 30% presenting today for lower abdominal pain.  Patient states for the past 4 days she has had lower abdominal pain that is started to become present in the right upper region.  She is also having continuous nausea and vomiting.  She denies diarrhea or constipation.  No bowel movements in 4 days.  Otherwise denies fever, chills, chest pain, shortness of breath, dysuria, hematuria.  Prior abdominal surgeries include appendectomy, adrenal gland removal, and hysterectomy.     Physical Exam   Triage Vital Signs: ED Triage Vitals  Encounter Vitals Group     BP 06/30/23 1313 (!) 93/59     Systolic BP Percentile --      Diastolic BP Percentile --      Pulse Rate 06/30/23 1313 (!) 104     Resp 06/30/23 1313 20     Temp 06/30/23 1313 97.8 F (36.6 C)     Temp src --      SpO2 06/30/23 1316 95 %     Weight 06/30/23 1314 153 lb (69.4 kg)     Height 06/30/23 1314 5' (1.524 m)     Head Circumference --      Peak Flow --      Pain Score 06/30/23 1314 10     Pain Loc --      Pain Education --      Exclude from Growth Chart --     Most recent vital signs: Vitals:   06/30/23 1313 06/30/23 1316  BP: (!) 93/59   Pulse: (!) 104   Resp: 20   Temp: 97.8 F (36.6 C)   SpO2:  95%   Physical Exam: I have reviewed the vital signs and nursing notes. General: Awake, alert, no acute distress.  Nontoxic appearing. Head:  Atraumatic, normocephalic.   ENT:  EOM intact, PERRL. Oral mucosa is pink and moist with no lesions. Neck: Neck is supple with full range of motion, No meningeal signs. Cardiovascular:  RRR, No murmurs. Peripheral pulses palpable and equal bilaterally. Respiratory:  Symmetrical chest wall expansion.  No  rhonchi, rales, or wheezes.  Good air movement throughout.  No use of accessory muscles.   Musculoskeletal:  No cyanosis or edema. Moving extremities with full ROM Abdomen:  Soft, tenderness palpation throughout the entire abdomen but most prominent in the bilateral lower quadrants, nondistended. Neuro:  GCS 15, moving all four extremities, interacting appropriately. Speech clear. Psych:  Calm, appropriate.   Skin:  Warm, dry, no rash.    ED Results / Procedures / Treatments   Labs (all labs ordered are listed, but only abnormal results are displayed) Labs Reviewed  COMPREHENSIVE METABOLIC PANEL - Abnormal; Notable for the following components:      Result Value   Glucose, Bld 169 (*)    Total Bilirubin 1.7 (*)    All other components within normal limits  CBC - Abnormal; Notable for the following components:   RBC 5.61 (*)    Hemoglobin 16.0 (*)    HCT 49.3 (*)    All other components within normal limits  URINALYSIS, ROUTINE W REFLEX MICROSCOPIC - Abnormal; Notable for the following components:   Color, Urine YELLOW (*)  APPearance HAZY (*)    Specific Gravity, Urine 1.039 (*)    Glucose, UA >=500 (*)    Ketones, ur 20 (*)    Protein, ur 30 (*)    All other components within normal limits  LIPASE, BLOOD     EKG    RADIOLOGY Independently interpreted CT abdomen/pelvis with concern for small bowel obstruction   PROCEDURES:  Critical Care performed: No  Procedures   MEDICATIONS ORDERED IN ED: Medications  sodium chloride 0.9 % bolus 500 mL (500 mLs Intravenous New Bag/Given 06/30/23 1429)  ondansetron (ZOFRAN) injection 4 mg (4 mg Intravenous Given 06/30/23 1430)  iohexol (OMNIPAQUE) 300 MG/ML solution 100 mL (100 mLs Intravenous Contrast Given 06/30/23 1515)     IMPRESSION / MDM / ASSESSMENT AND PLAN / ED COURSE  I reviewed the triage vital signs and the nursing notes.                              Differential diagnosis includes, but is not limited to,  SBO, colitis, enteritis, cholecystitis, diverticulitis, acute cystitis, pyelonephritis  Patient's presentation is most consistent with acute complicated illness / injury requiring diagnostic workup.  Patient is a 78 year old female presenting today with generalized abdominal pain worse in the lower quadrants associated with nausea and vomiting.  No bowel movements in 4 days.  Initial tachycardia and slight hypotension but patient not having lightheaded symptoms.  Laboratory workup largely reassuring and no evidence of UTI although dehydration is present.  CT imaging ordered shows concern for partial small bowel obstruction.  Discussed the case with surgery who agrees that there is concerning findings for small bowel obstruction secondary to adhesions.  Recommends placing NG tube and admitting to medicine and he will continue to follow along.  Patient admitted to hospitalist for further care.  Clinical Course as of 06/30/23 1632  Sat Jun 30, 2023  1623 Dr. Maia Plan - put in NG tube, admit to medicine [DW]    Clinical Course User Index [DW] Anner Crete Donetta Potts, MD     FINAL CLINICAL IMPRESSION(S) / ED DIAGNOSES   Final diagnoses:  Small bowel obstruction due to adhesions [facility])     Rx / DC Orders   ED Discharge Orders     None        Note:  This document was prepared using Dragon voice recognition software and may include unintentional dictation errors.   Janith Lima, MD 06/30/23 [phone]

## 2023-06-30 NOTE — Assessment & Plan Note (Addendum)
 Recent left heart cath with chronically occluded LAD, not amenable to PCI or CABG.  - Resume home Plavix postoperatively when cleared by surgery - Hold home atorvastatin pending bowel function return

## 2023-07-01 ENCOUNTER — Inpatient Hospital Stay: Payer: Medicare Other

## 2023-07-01 DIAGNOSIS — I10 Essential (primary) hypertension: Secondary | ICD-10-CM | POA: Diagnosis not present

## 2023-07-01 DIAGNOSIS — M069 Rheumatoid arthritis, unspecified: Secondary | ICD-10-CM

## 2023-07-01 DIAGNOSIS — K430 Incisional hernia with obstruction, without gangrene: Principal | ICD-10-CM

## 2023-07-01 DIAGNOSIS — I5022 Chronic systolic (congestive) heart failure: Secondary | ICD-10-CM | POA: Diagnosis not present

## 2023-07-01 LAB — COMPREHENSIVE METABOLIC PANEL
ALT: 16 U/L (ref 0–44)
AST: 18 U/L (ref 15–41)
Albumin: 3.4 g/dL — ABNORMAL LOW (ref 3.5–5.0)
Alkaline Phosphatase: 39 U/L (ref 38–126)
Anion gap: 11 (ref 5–15)
BUN: 21 mg/dL (ref 8–23)
CO2: 25 mmol/L (ref 22–32)
Calcium: 8.7 mg/dL — ABNORMAL LOW (ref 8.9–10.3)
Chloride: 104 mmol/L (ref 98–111)
Creatinine, Ser: 0.57 mg/dL (ref 0.44–1.00)
GFR, Estimated: >60 mL/min (ref >60)
Glucose, Bld: 190 mg/dL — ABNORMAL HIGH (ref 70–99)
Potassium: 4 mmol/L (ref 3.5–5.1)
Sodium: 140 mmol/L (ref 135–145)
Total Bilirubin: 0.9 mg/dL (ref 0.0–1.2)
Total Protein: 6.1 g/dL — ABNORMAL LOW (ref 6.5–8.1)

## 2023-07-01 LAB — TYPE AND SCREEN
ABO/RH(D): O POS
Antibody Screen: NEGATIVE

## 2023-07-01 LAB — GLUCOSE, CAPILLARY
Glucose-Capillary: 157 mg/dL — ABNORMAL HIGH (ref 70–99)
Glucose-Capillary: 123 mg/dL — ABNORMAL HIGH (ref 70–99)
Glucose-Capillary: 133 mg/dL — ABNORMAL HIGH (ref 70–99)
Glucose-Capillary: 137 mg/dL — ABNORMAL HIGH (ref 70–99)

## 2023-07-01 LAB — CBC
HCT: 41.3 % (ref 36.0–46.0)
Hemoglobin: 13.2 g/dL (ref 12.0–15.0)
MCH: 28.4 pg (ref 26.0–34.0)
MCHC: 32 g/dL (ref 30.0–36.0)
MCV: 88.8 fL (ref 80.0–100.0)
Platelets: 302 10*3/uL (ref 150–400)
RBC: 4.65 MIL/uL (ref 3.87–5.11)
RDW: 15.1 % (ref 11.5–15.5)
WBC: 8.2 10*3/uL (ref 4.0–10.5)
nRBC: 0 % (ref 0.0–0.2)

## 2023-07-01 LAB — CBG MONITORING, ED: Glucose-Capillary: 212 mg/dL — ABNORMAL HIGH (ref 70–99)

## 2023-07-01 LAB — HEMOGLOBIN A1C
Hgb A1c MFr Bld: 7 % — ABNORMAL HIGH (ref 4.8–5.6)
Mean Plasma Glucose: 154.2 mg/dL

## 2023-07-01 LAB — PHOSPHORUS: Phosphorus: 5.1 mg/dL — ABNORMAL HIGH (ref 2.5–4.6)

## 2023-07-01 LAB — MAGNESIUM: Magnesium: 1.8 mg/dL (ref 1.7–2.4)

## 2023-07-01 MED ORDER — METOPROLOL TARTRATE 5 MG/5ML IV SOLN
5.0000 mg | Freq: Two times a day (BID) | INTRAVENOUS | Status: DC
  Administered 2023-07-01 – 2023-07-02 (×2): 5 mg via INTRAVENOUS
  Filled 2023-07-01 (×2): qty 5

## 2023-07-01 MED ORDER — PHENYLEPHRINE HCL-NACL 20-0.9 MG/250ML-% IV SOLN: 0.0000 ug/min | INTRAVENOUS | Status: DC

## 2023-07-01 MED ORDER — ALBUMIN HUMAN 25 % IV SOLN
25.0000 g | Freq: Once | INTRAVENOUS | Status: AC
  Administered 2023-07-01: 25 g via INTRAVENOUS
  Filled 2023-07-01: qty 100

## 2023-07-01 MED ORDER — OXYCODONE HCL 5 MG PO TABS: 5.0000 mg | ORAL_TABLET | Freq: Once | ORAL | Status: DC | PRN

## 2023-07-01 MED ORDER — PHENYLEPHRINE HCL-NACL 20-0.9 MG/250ML-% IV SOLN
25.0000 ug/min | INTRAVENOUS | Status: DC
  Filled 2023-07-01: qty 250

## 2023-07-01 MED ORDER — ALBUMIN HUMAN 5 % IV SOLN
INTRAVENOUS | Status: AC
  Filled 2023-07-01: qty 250

## 2023-07-01 MED ORDER — LORAZEPAM 2 MG/ML IJ SOLN: 0.5000 mg | Freq: Four times a day (QID) | INTRAMUSCULAR | Status: DC | PRN

## 2023-07-01 MED ORDER — DIATRIZOATE MEGLUMINE & SODIUM 66-10 % PO SOLN
90.0000 mL | Freq: Once | ORAL | Status: AC
  Administered 2023-07-01: 90 mL via NASOGASTRIC

## 2023-07-01 MED ORDER — CLOPIDOGREL BISULFATE 75 MG PO TABS
75.0000 mg | ORAL_TABLET | Freq: Every day | ORAL | Status: DC
  Administered 2023-07-02: 75 mg
  Filled 2023-07-01 (×2): qty 1

## 2023-07-01 MED ORDER — FENTANYL CITRATE (PF) 100 MCG/2ML IJ SOLN
INTRAMUSCULAR | Status: AC
  Filled 2023-07-01: qty 2

## 2023-07-01 MED ORDER — OXYCODONE HCL 5 MG/5ML PO SOLN: 5.0000 mg | Freq: Once | ORAL | Status: DC | PRN

## 2023-07-01 MED ORDER — INSULIN ASPART 100 UNIT/ML IJ SOLN
0.0000 [IU] | INTRAMUSCULAR | Status: DC
  Administered 2023-07-01 (×2): 2 [IU] via SUBCUTANEOUS
  Administered 2023-07-01 – 2023-07-02 (×2): 3 [IU] via SUBCUTANEOUS
  Filled 2023-07-01 (×4): qty 1

## 2023-07-01 MED ORDER — SUGAMMADEX SODIUM 200 MG/2ML IV SOLN
INTRAVENOUS | Status: DC | PRN
  Administered 2023-07-01: 200 mg via INTRAVENOUS

## 2023-07-01 MED ORDER — FENTANYL CITRATE (PF) 100 MCG/2ML IJ SOLN
25.0000 ug | INTRAMUSCULAR | Status: DC | PRN
  Administered 2023-07-01: 50 ug via INTRAVENOUS
  Administered 2023-07-01 (×2): 25 ug via INTRAVENOUS

## 2023-07-01 NOTE — Progress Notes (Addendum)
       CROSS COVER NOTE  NAME: Tammy Ashley MRN: [id number] DOB : 17-Oct-1945 ATTENDING PHYSICIAN: Verdene Lennert, MD    Date of Service   07/01/2023   HPI/Events of Note     Interventions   Assessment/Plan:    07/01/2023    3:45 AM 07/01/2023    3:39 AM 07/01/2023    3:30 AM  Vitals with BMI  Systolic 102  106  Diastolic 45  56  Pulse 108 102 108   MAPs 60-65 One dose of albumin given 25 gms of 25% Sleepy but aroused easily; able to state name, oriented to situation and able to discuss medical hx info ST with PVC on monitor Foley in place, good UOP Per patient normal systolic around 100 Cmp   Latest Reference Range & Units 07/01/23 03:38  COMPREHENSIVE METABOLIC PANEL  Rpt !  Sodium 135 - 145 mmol/L 140  Potassium 3.5 - 5.1 mmol/L 4.0  Chloride 98 - 111 mmol/L 104  CO2 22 - 32 mmol/L 25  Glucose 70 - 99 mg/dL 244 (H)  BUN 8 - 23 mg/dL 21  Creatinine 0.10 - 2.72 mg/dL 5.36  Calcium 8.9 - 64.4 mg/dL 8.7 (L)  Anion gap 5 - 15  11  Phosphorus 2.5 - 4.6 mg/dL 5.1 (H)  Magnesium 1.7 - 2.4 mg/dL 1.8  Alkaline Phosphatase 38 - 126 U/L 39  Albumin 3.5 - 5.0 g/dL 3.4 (L)  AST 15 - 41 U/L 18  ALT 0 - 44 U/L 16  Total Protein 6.5 - 8.1 g/dL 6.1 (L)  Total Bilirubin 0.0 - 1.2 mg/dL 0.9  GFR, Estimated >03 mL/min >60  !: Data is abnormal (H): Data is abnormally high (L): Data is abnormally low Rpt: View report in Results Review for more information  Latest Reference Range & Units 07/01/23 03:38  COMPREHENSIVE METABOLIC PANEL  Rpt !  Sodium 135 - 145 mmol/L 140  Potassium 3.5 - 5.1 mmol/L 4.0  Chloride 98 - 111 mmol/L 104  CO2 22 - 32 mmol/L 25  Glucose 70 - 99 mg/dL 474 (H)  BUN 8 - 23 mg/dL 21  Creatinine 2.59 - 5.63 mg/dL 8.75  Calcium 8.9 - 64.3 mg/dL 8.7 (L)  Anion gap 5 - 15  11  Phosphorus 2.5 - 4.6 mg/dL 5.1 (H)  Magnesium 1.7 - 2.4 mg/dL 1.8  Alkaline Phosphatase 38 - 126 U/L 39  Albumin 3.5 - 5.0 g/dL 3.4 (L)  AST 15 - 41 U/L 18  ALT 0 - 44 U/L  16  Total Protein 6.5 - 8.1 g/dL 6.1 (L)  Total Bilirubin 0.0 - 1.2 mg/dL 0.9  GFR, Estimated >32 mL/min >60  !: Data is abnormal (H): Data is abnormally high (L): Data is abnormally low Rpt: View report in Results Review for more information   Notify provider for MAP  less than 60-65 Insulin change to every 4 h with glucose monitoring while npo Bed change to progressive Oxygen keep sats above 92% IS and flutter valve therapies Progressive mobility protocol   daughter updated bedside in PACU   Donnie Mesa NP [facility] Cross Cover 7pm-7am - check amion for availability Pager [phone]

## 2023-07-01 NOTE — TOC Initial Note (Signed)
 Transition of Care [facility]) - Initial/Assessment Note    Patient Details  Name: Tammy Ashley MRN: [id number] Date of Birth: 10-15-1945  Transition of Care [facility]) CM/SW Contact:    Liliana Cline, LCSW Phone Number: 07/01/2023, 5:07 PM  Clinical Narrative:                 Patient is from home alone. Her son and grandson live nearby. Daughter is also involved and supportive. Family provides transportation. PCP is Dr. Evlyn Kanner. Pharmacy is [facility]. Confirmed address in chart is correct. Patient has a rollator, wheelchair, and shower chair at home. Patient and [name]) are aware of SNF recommendation. They decline SNF, prefer home with home health and are aware family would have to provide a majority of the care/supervision. CSW sent Medicare Care Compare list of [facility] agencies and SNFs to [facility] by email for him to review. He requests TOC follow up with him tomorrow regarding [facility] agency preference so that a referral can be made.  Expected Discharge Plan: Home w Home Health Services Barriers to Discharge: Continued Medical Work up   Patient Goals and CMS Choice   CMS Medicare.gov Compare Post Acute Care list provided to:: Patient Represenative (must comment) Choice offered to / list presented to : Adult Children      Expected Discharge Plan and Services       Living arrangements for the past 2 months: Single Family Home                                      Prior Living Arrangements/Services Living arrangements for the past 2 months: Single Family Home Lives with:: Self Patient language and need for interpreter reviewed:: Yes Do you feel safe going back to the place where you live?: Yes      Need for Family Participation in Patient Care: Yes (Comment) Care giver support system in place?: Yes (comment) Current home services: DME Criminal Activity/Legal Involvement Pertinent to Current Situation/Hospitalization: No - Comment as needed  Activities of Daily  Living      Permission Sought/Granted Permission sought to share information with : Facility Industrial/product designer granted to share information with : Yes, Verbal Permission Granted     Permission granted to share info w AGENCY: Home Health agencies        Emotional Assessment         Alcohol / Substance Use: Not Applicable Psych Involvement: No (comment)  Admission diagnosis:  SBO (small bowel obstruction) (HCC) [K56.609] Small bowel obstruction due to adhesions [facility]) [K56.50] Patient Active Problem List   Diagnosis Date Noted   Chronic HFrEF (heart failure with reduced ejection fraction) (HCC) 06/30/2023   Coronary artery disease 06/30/2023   Type 2 diabetes mellitus (HCC) 06/30/2023   Essential hypertension 06/30/2023   Pheochromocytoma 06/30/2023   SBO (small bowel obstruction) (HCC) 06/30/2023   Psoriasis 10/19/2016   S/P total knee replacement using cement 09/25/2013   Arthritis, rheumatoid (HCC) 10/15/2012   Uterine cancer (HCC) 10/15/2012   PCP:  Adrian Prince, MD [phone] [place] [place] - [address] [place] [address] Phone: [phone] Fax: [phone]     Social Drivers of Health (SDOH) Social History: SDOH Screenings   Financial Resource Strain: Medium Risk (05/25/2023)  Tobacco Use: Medium Risk (06/30/2023)   SDOH Interventions:     Readmission Risk Interventions  07/01/2023    5:06 PM  Readmission Risk Prevention Plan  Post Dischage Appt Complete  Medication Screening Complete  Transportation Screening Complete

## 2023-07-01 NOTE — Plan of Care (Signed)

## 2023-07-01 NOTE — Evaluation (Signed)
 Physical Therapy Evaluation Patient Details Name: Tammy Ashley MRN: [id number] DOB: 1945-07-15 Today's Date: 07/01/2023  History of Present Illness  Pt is a 78 y/o F admitted on 06/30/23 after presenting with c/o abdominal pain. Abdominal CT concerning for SBO vs enteritis with complex ventral abdominal wall hernia. Pt is s/p small bowel resection & repair of incisional hernia. PMH: HFpEF, Obstructive CAD, pheochromocytoma s/p R adrenalectomy, RA, DM2, mild pulmonary HTN, endometrial CA (2010), HLD  Clinical Impression  Pt seen for PT evaluation with pt agreeable, son Tammy Ashley) present for session. PT educates pt on log rolling for comfort with pt requiring +2 to fully upright trunk to sitting EOB. Pt is able to progress to sit>stand with 1 person HHA, step pivot to recliner with 2 person HHA. Pt is significantly limited by pain but does participate with encouragement & PT educated pt on importance of early & OOB mobility. Will continue to follow pt acutely to progress mobility as able.        If plan is discharge home, recommend the following: A lot of help with walking and/or transfers;A lot of help with bathing/dressing/bathroom;Assist for transportation;Assistance with cooking/housework;Help with stairs or ramp for entrance   Can travel by private vehicle   No    Equipment Recommendations Other (comment) (defer to next venue)  Recommendations for Other Services       Functional Status Assessment Patient has had a recent decline in their functional status and demonstrates the ability to make significant improvements in function in a reasonable and predictable amount of time.     Precautions / Restrictions Precautions Precautions: Fall Precaution/Restrictions Comments: log roll for comfort, abdominal wound vac, NG tube Restrictions Weight Bearing Restrictions Per Provider Order: No      Mobility  Bed Mobility Overal bed mobility: Needs Assistance Bed Mobility: Rolling,  Sidelying to Sit Rolling: Min assist, Used rails Sidelying to sit: +2 for physical assistance, Max assist       General bed mobility comments: 3 attempts for sidelying>sitting with pt finally successful with sitting upright on EOB on 3rd attempt. Pt with decreased ability to push/upright trunk 2/2 pain.    Transfers Overall transfer level: Needs assistance Equipment used: 2 person hand held assist, 1 person hand held assist Transfers: Sit to/from Stand, Bed to chair/wheelchair/BSC Sit to Stand: Min assist (STS from EOB with 1 person HHA)   Step pivot transfers: Min assist, +2 physical assistance, +2 safety/equipment (step pivot bed>recliner with 2 person HHA, pt requries extra time for stepping but able to do so without assistance)            Ambulation/Gait                  Stairs            Wheelchair Mobility     Tilt Bed    Modified Rankin (Stroke Patients Only)       Balance Overall balance assessment: Needs assistance Sitting-balance support: Feet supported Sitting balance-Leahy Scale: Poor Sitting balance - Comments: pt initially requiring significant assistance for sitting EOB, posterior lean but eventually able to progress to sitting with close supervision Postural control: Posterior lean Standing balance support: Bilateral upper extremity supported, During functional activity, Reliant on assistive device for balance Standing balance-Leahy Scale: Poor                               Pertinent Vitals/Pain Pain Assessment Pain Assessment: Faces  Faces Pain Scale: Hurts worst Pain Location: abdomen Pain Descriptors / Indicators: Grimacing, Guarding, Discomfort Pain Intervention(s): RN gave pain meds during session, Limited activity within patient's tolerance, Repositioned, Utilized relaxation techniques    Home Living Family/patient expects to be discharged to:: Private residence Living Arrangements: Alone Available Help at  Discharge: Family;Available PRN/intermittently Type of Home: House Home Access: Stairs to enter Entrance Stairs-Rails: Doctor, general practice of Steps: 5   Home Layout: One level Home Equipment: Rollator (4 wheels);Wheelchair - manual;Shower seat      Prior Function               Mobility Comments: Ambulatory with rollator in the home, w/c outside of the home for long distances. Denies falls. Kids provide assistance with transportation, meals. ADLs Comments: Son provides assistance PRN (supervision always) for transfers in/out of tub but pt able to wash herself, dress without assistance.     Extremity/Trunk Assessment   Upper Extremity Assessment Upper Extremity Assessment: Generalized weakness    Lower Extremity Assessment Lower Extremity Assessment: Generalized weakness    Cervical / Trunk Assessment Cervical / Trunk Assessment:  (abdominal wound vac, NG tube)  Communication   Communication Communication: No apparent difficulties Factors Affecting Communication: Hearing impaired    Cognition Arousal: Alert Behavior During Therapy: Anxious   PT - Cognitive impairments: No apparent impairments                           Following commands impaired: Only follows one step commands consistently, Follows one step commands with increased time     Cueing Cueing Techniques: Verbal cues, Gestural cues, Visual cues, Tactile cues     General Comments General comments (skin integrity, edema, etc.): pt on 4L/min via nasal cannula throughout session, HR 113-129 bpm    Exercises     Assessment/Plan    PT Assessment Patient needs continued PT services  PT Problem List Decreased strength;Decreased coordination;Cardiopulmonary status limiting activity;Pain;Decreased range of motion;Impaired sensation;Decreased activity tolerance;Decreased knowledge of use of DME;Decreased balance;Decreased safety awareness;Decreased skin integrity;Decreased mobility        PT Treatment Interventions DME instruction;Balance training;Modalities;Neuromuscular re-education;Gait training;Stair training;Functional mobility training;Patient/family education;Therapeutic activities;Therapeutic exercise;Manual techniques    PT Goals (Current goals can be found in the Care Plan section)  Acute Rehab PT Goals Patient Stated Goal: decreased pain, get better PT Goal Formulation: With patient Time For Goal Achievement: 07/15/23 Potential to Achieve Goals: Good    Frequency Min 1X/week     Co-evaluation               AM-PAC PT "6 Clicks" Mobility  Outcome Measure Help needed turning from your back to your side while in a flat bed without using bedrails?: A Lot Help needed moving from lying on your back to sitting on the side of a flat bed without using bedrails?: Total Help needed moving to and from a bed to a chair (including a wheelchair)?: A Lot Help needed standing up from a chair using your arms (e.g., wheelchair or bedside chair)?: A Little Help needed to walk in hospital room?: A Lot Help needed climbing 3-5 steps with a railing? : Total 6 Click Score: 11    End of Session Equipment Utilized During Treatment: Oxygen Activity Tolerance: Patient tolerated treatment well;Patient limited by pain Patient left: in chair;with chair alarm set;with nursing/sitter in room;with call bell/phone within reach;with family/visitor present Nurse Communication: Mobility status PT Visit Diagnosis: Pain;Unsteadiness on feet (R26.81);Muscle weakness (generalized) (M62.81);Difficulty in  walking, not elsewhere classified (R26.2) Pain - part of body:  (abdominal)    Time: 4742-5956 PT Time Calculation (min) (ACUTE ONLY): 27 min   Charges:   PT Evaluation $PT Eval Moderate Complexity: 1 Mod   PT General Charges $$ ACUTE PT VISIT: 1 Visit         Aleda Grana, PT, DPT 07/01/23, 2:14 PM   Sandi Mariscal 07/01/2023, 2:13 PM

## 2023-07-01 NOTE — Anesthesia Postprocedure Evaluation (Signed)
 Anesthesia Post Note  Patient: Tammy Ashley  Procedure(s) Performed: XI ROBOTIC ASSISTED VENTRAL HERNIA OPEN SMALL BOWEL RESECTION  Patient location during evaluation: PACU Anesthesia Type: General Level of consciousness: awake and alert Pain management: pain level controlled Vital Signs Assessment: post-procedure vital signs reviewed and stable Respiratory status: spontaneous breathing, nonlabored ventilation, respiratory function stable and patient connected to nasal cannula oxygen Cardiovascular status: blood pressure returned to baseline and stable Postop Assessment: no apparent nausea or vomiting Anesthetic complications: no  No notable events documented.   Last Vitals:  Vitals:   07/01/23 0126 07/01/23 0130  BP: (!) 102/44 (!) 99/43  Pulse: 92 90  Resp: (!) 29 (!) 27  Temp:    SpO2: 98% 98%    Last Pain:  Vitals:   07/01/23 0122  TempSrc:   PainSc: Asleep                 Stephanie Coup

## 2023-07-01 NOTE — Progress Notes (Signed)
 1      PROGRESS NOTE    Tammy Ashley  ZOX:[id number] DOB: 12-Aug-1945 DOA: 06/30/2023 PCP: Adrian Prince, MD    Brief Narrative:  78 y.o. female with medical history significant of HFpEF with last EF of 25-30%, obstructive CAD, pheochromocytoma s/p right adrenalectomy, rheumatoid arthritis, type 2 diabetes, mild pulmonary hypertension, endometrial cancer (2010), hyperlipidemia, who presents to the ED due to abdominal pain   2/15: Surgery consult for strangulated incisional hernia  2/16: Robotic assisted laparoscopic strangulated incisional hernia repair with mesh Small bowel resection with anastomosis   Assessment & Plan:   Principal Problem:   SBO (small bowel obstruction) (HCC) Active Problems:   Chronic HFrEF (heart failure with reduced ejection fraction) (HCC)   Essential hypertension   Arthritis, rheumatoid (HCC)   Coronary artery disease   Type 2 diabetes mellitus (HCC)  * SBO (small bowel obstruction) (HCC) Strangulated incisional hernia Patient is presenting with several day history of intractable nausea/vomiting with mild abdominal pain with findings concerning for SBO versus inflammation.  Given multiple previous abdominal surgeries and complicated ventral hernia, she is admitted for strangulated incisional hernia - General Surgery following - Status post robotic assisted laparoscopic incisional hernia repair yesterday evening on 2/15.  POD 1 - Continue NG tube at low intermittent suction - Further management per surgical team   Chronic HFrEF (heart failure with reduced ejection fraction) (HCC) Recently diagnosed HFrEF with last EF of 25-30%, currently established with heart failure clinic followed by Dr. Shirlee Latch.  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  Patient appears hypovolemic, in the setting of vomiting and SBO.   - Telemetry monitoring - Hold home GDMT and diuretics - Daily weights - Will add IV Lopressor for better blood pressure and heart rate  control   Essential hypertension Previous history of hypertension, with chronic hypotension after being started on GDMT for HFrEF.  Per cardiology note, SBP generally between 90-100 now.  Blood pressure currently within that range.   - Hold home regimen given n.p.o. with NG tube -Add IV Lopressor   Type 2 diabetes mellitus (HCC) History of type 2 diabetes with no recent A1c available in chart. Currently managed with Farxiga and metformin.   - SSI, sensitive - A1c 7 - Hold home regimen   Coronary artery disease Recent left heart cath with chronically occluded LAD, not amenable to PCI or CABG.   - Resume home Plavix.  Okay by surgery - Hold home atorvastatin pending bowel function return   Arthritis, rheumatoid (HCC) Currently managed with Orencia and leflunomide.    - Hold home regimen at this time     DVT prophylaxis: SCDs SCDs Start: 06/30/23 1735     Code Status: (Full code Family Communication: None at bedside Disposition Plan: Possible discharge in next 2 to 3 days depending on clinical condition and postop recovery.  Will need SNF at discharge   Consultants:  Surgery  Procedures:  Robotic assisted laparoscopic strangulated incisional hernia repair with mesh Small bowel resection with anastomosis    Subjective:  She is complaining of pain at the surgical site.  She is uncomfortable.  Unable to pass gas  Objective: Vitals:   07/01/23 0500 07/01/23 0523 07/01/23 0851 07/01/23 1306  BP: (!) 92/45 (!) 102/54 (!) 108/55 (!) 113/50  Pulse: 89 97 (!) 101 (!) 113  Resp: 13 20 18    Temp: (!) 97.4 F (36.3 C) 98.2 F (36.8 C) 98.4 F (36.9 C)   TempSrc:  Oral    SpO2:  96% 93% 94% 95%  Weight:      Height:        Intake/Output Summary (Last 24 hours) at 07/01/2023 1513 Last data filed at 07/01/2023 1500 Gross per 24 hour  Intake 1938.53 ml  Output 750 ml  Net 1188.53 ml   Filed Weights   06/30/23 1314  Weight: 69.4 kg    Examination:  General  exam: Appears calm and comfortable  Respiratory system: Clear to auscultation. Respiratory effort normal. Cardiovascular system: S1 & S2 heard, RRR. No JVD, murmurs, rubs, gallops or clicks. No pedal edema. Gastrointestinal system: Abdomen is nondistended, soft and surgical site/staples/dressing in place Central nervous system: Alert and oriented. No focal neurological deficits. Extremities: Symmetric 5 x 5 power. Skin: No rashes, lesions or ulcers Psychiatry: Judgement and insight appear normal. Mood & affect appropriate.     Data Reviewed: I have personally reviewed following labs and imaging studies  CBC: Recent Labs  Lab 06/30/23 1316 07/01/23 0338  WBC 8.2 8.2  HGB 16.0* 13.2  HCT 49.3* 41.3  MCV 87.9 88.8  PLT 337 302   Basic Metabolic Panel: Recent Labs  Lab 06/30/23 1316 07/01/23 0338  NA 137 140  K 4.6 4.0  CL 100 104  CO2 23 25  GLUCOSE 169* 190*  BUN 21 21  CREATININE 0.59 0.57  CALCIUM 9.2 8.7*  MG  --  1.8  PHOS  --  5.1*   GFR: Estimated Creatinine Clearance: 51.2 mL/min (by C-G formula based on SCr of 0.57 mg/dL). Liver Function Tests: Recent Labs  Lab 06/30/23 1316 07/01/23 0338  AST 23 18  ALT 10 16  ALKPHOS 41 39  BILITOT 1.7* 0.9  PROT 6.8 6.1*  ALBUMIN 3.7 3.4*   Recent Labs  Lab 06/30/23 1316  LIPASE 19    HbA1C: Recent Labs    07/01/23 0338  HGBA1C 7.0*   CBG: Recent Labs  Lab 07/01/23 0122 07/01/23 0923 07/01/23 1304  GLUCAP 212* 157* 123*     No results found for this or any previous visit (from the past 240 hours).       Radiology Studies: DG Abd Portable 1V Result Date: 07/01/2023 CLINICAL DATA:  Check gastric catheter placement EXAM: PORTABLE ABDOMEN - 1 VIEW COMPARISON:  None Available. FINDINGS: Gastric catheter is coiled within the stomach. No free air is noted. No obstructive changes are noted. Postsurgical changes are seen. IMPRESSION: Gastric catheter within the stomach. Electronically Signed   By:  Alcide Clever M.D.   On: 07/01/2023 02:39   DG C-Arm 1-60 Min-No Report Result Date: 07/01/2023 Fluoroscopy was utilized by the requesting physician.  No radiographic interpretation.   CT ABDOMEN PELVIS W CONTRAST Result Date: 06/30/2023 CLINICAL DATA:  Lower abdominal pain. Right upper quadrant pain. Nausea and vomiting. EXAM: CT ABDOMEN AND PELVIS WITH CONTRAST TECHNIQUE: Multidetector CT imaging of the abdomen and pelvis was performed using the standard protocol following bolus administration of intravenous contrast. RADIATION DOSE REDUCTION: This exam was performed according to the departmental dose-optimization program which includes automated exposure control, adjustment of the mA and/or kV according to patient size and/or use of iterative reconstruction technique. CONTRAST:  OMNIPAQUE IOHEXOL 300 MG/ML  SOLN COMPARISON:  Noncontrast CT 08/11/2016 FINDINGS: Lower chest: Upper normal heart size. Wall thickening of the distal esophagus. No basilar airspace disease or pleural effusion. Hepatobiliary: Stable cyst in the left lobe of the liver. No suspicious liver lesion. Gallbladder physiologically distended, no calcified stone. No biliary dilatation. Pancreas: Unremarkable. No pancreatic ductal  dilatation or surrounding inflammatory changes. Spleen: Normal in size without focal abnormality. Adrenals/Urinary Tract: Stable left adrenal thickening. Stable right adrenal calcifications. No further follow-up imaging is recommended. No hydronephrosis. Punctate nonobstructing stone in the upper pole of the left kidney. No suspicious renal lesion. Right renal cyst. No further follow-up imaging is recommended. Unremarkable urinary bladder. Stomach/Bowel: Wall thickening of the distal esophagus. Mild fluid-filled stomach without abnormal distension. Dilated fluid-filled small bowel with bowel wall thickening and mesenteric edema. Short segment of small bowel extends into a ventral abdominal wall hernia just to  the left of midline, however the bowel both proximal and distal to this appears inflamed. There is general transition from dilated and inflamed to nondilated in the right mid abdomen without discrete transition point. Appendectomy. Colonic diverticulosis, prominent in the left colon, without diverticulitis. Vascular/Lymphatic: Aortic and branch atherosclerosis. No aortic aneurysm. The portal vein is patent. No abdominopelvic adenopathy. Reproductive: Status post hysterectomy. No adnexal masses. Other: Complex ventral abdominal wall hernia. Left paraumbilical component contains a short segment of small bowel. There is adjacent fat stranding and inflammation within the hernia sac as well as adjacent subcutaneous tissues. Smaller portions of the hernia contains only fat. A supraumbilical component of the hernia contains anti mesenteric border of transverse colon but no associated inflammation. There is a separate upper abdominal ventral abdominal wall hernia containing only fat. Small amount of mesenteric free fluid and free fluid in the pelvis. No free air. Musculoskeletal: Scoliosis and degenerative change in the spine. Advanced right hip arthropathy. There are no acute or suspicious osseous abnormalities. IMPRESSION: 1. Dilated fluid-filled small bowel with bowel wall thickening and mesenteric inflammation. There is a short segment of small bowel extending into a ventral abdominal wall hernia, however the bowel both proximal and distal to the hernia appear inflamed. There is general transition to nondilated small bowel distally. Findings may represent early small bowel obstruction or enteritis/inflammation. 2. Complex ventral abdominal wall hernia, component containing short segment of small bowel. Portion of the hernia also contains anti mesenteric border of transverse colon. There is fat stranding within and adjacent to the hernia likely related small bowel inflammation. 3. Wall thickening of the distal esophagus,  query reflux. 4. Nonobstructing left renal stone. 5. Colonic diverticulosis without focal diverticulitis. Aortic Atherosclerosis (ICD10-I70.0). Electronically Signed   By: Narda Rutherford M.D.   On: 06/30/2023 16:07        Scheduled Meds:  insulin aspart  0-15 Units Subcutaneous Q4H   sodium chloride flush  3 mL Intravenous Q12H   Continuous Infusions:   LOS: 1 day    Time spent: 35 minutes    Delfino Lovett, MD [facility] Pager [phone]  If 7PM-7AM, please contact night-coverage  07/01/2023, 3:13 PM

## 2023-07-01 NOTE — Progress Notes (Signed)
 Patient ID: Tammy Ashley, female   DOB: 1945-07-20, 78 y.o.   MRN: [id number]     SURGICAL PROGRESS NOTE   Hospital Day(s): 1.   Interval History: Patient seen and examined, no acute events or new complaints overnight. Patient reports feeling sore in the lower abdomen.  Patient currently denies any chest pain.  Denies any shortness of breath.  She does feel sleepy.  Vital signs in last 24 hours: [min-max] current  Temp:  [97 F (36.1 C)-98.2 F (36.8 C)] 98.2 F (36.8 C) (02/16 0523) Pulse Rate:  [87-109] 97 (02/16 0523) Resp:  [13-33] 20 (02/16 0523) BP: (76-111)/(25-62) 102/54 (02/16 0523) SpO2:  [89 %-99 %] 93 % (02/16 0523) Weight:  [69.4 kg] 69.4 kg (02/15 1314)     Height: 5' (152.4 cm) Weight: 69.4 kg BMI (Calculated): 29.88   Physical Exam:  Constitutional: alert, cooperative and no distress  Respiratory: breathing non-labored at rest  Cardiovascular: regular rate and sinus rhythm  Gastrointestinal: soft, mild-tender, and mild-distended  Labs:     Latest Ref Rng & Units 07/01/2023    3:38 AM 06/30/2023    1:16 PM 06/07/2023    8:12 AM  CBC  WBC 4.0 - 10.5 K/uL 8.2  8.2    Hemoglobin 12.0 - 15.0 g/dL 40.9  81.1  91.4    78.2   Hematocrit 36.0 - 46.0 % 41.3  49.3  44.0    45.0   Platelets 150 - 400 K/uL 302  337        Latest Ref Rng & Units 07/01/2023    3:38 AM 06/30/2023    1:16 PM 06/20/2023    3:43 PM  CMP  Glucose 70 - 99 mg/dL 956  213  086   BUN 8 - 23 mg/dL 21  21  14    Creatinine 0.44 - 1.00 mg/dL 5.78  4.69  6.29   Sodium 135 - 145 mmol/L 140  137  142   Potassium 3.5 - 5.1 mmol/L 4.0  4.6  4.3   Chloride 98 - 111 mmol/L 104  100  101   CO2 22 - 32 mmol/L 25  23  24    Calcium 8.9 - 10.3 mg/dL 8.7  9.2  9.5   Total Protein 6.5 - 8.1 g/dL 6.1  6.8    Total Bilirubin 0.0 - 1.2 mg/dL 0.9  1.7    Alkaline Phos 38 - 126 U/L 39  41    AST 15 - 41 U/L 18  23    ALT 0 - 44 U/L 16  10      Imaging studies: No new pertinent imaging  studies   Assessment/Plan:  78 y.o. female with strangulating incisional hernia 1 Day Post-Op s/p small bowel resection and repair of incisional hernia, complicated by pertinent comorbidities including uncontrolled CHF, coronary artery disease, diabetes.  -Patient currently stable without clinical toleration from cardiac standpoint -Very high risk for cardiac deterioration due to recent diagnosis of uncontrolled CHF -BNP over 700 on admission -Abdomen is soft with suspected tenderness. -No issues with incision -Will give Gastrografin trial for further evaluation of postoperative ileus -Encouraged the patient to ambulate.  Will consult PT/OT for evaluation. -As per cardiologist patient will benefit of cardiac rehab   Appreciate hospitalist admission due to patient significant cardiac history.  No contraindication to restart Plavix  Gae Gallop, MD

## 2023-07-01 NOTE — Transfer of Care (Signed)
 Immediate Anesthesia Transfer of Care Note  Patient: Tammy Ashley  Procedure(s) Performed: XI ROBOTIC ASSISTED VENTRAL HERNIA OPEN SMALL BOWEL RESECTION  Patient Location: PACU  Anesthesia Type:General  Level of Consciousness: awake, alert , and oriented  Airway & Oxygen Therapy: Patient Spontanous Breathing  Post-op Assessment: post op VS reviewed and stable  Post vital signs: Reviewed and stable  Last Vitals:  Vitals Value Taken Time  BP 96/39 07/01/23 0119  Temp    Pulse 88 07/01/23 0123  Resp 29 07/01/23 0123  SpO2 96 % 07/01/23 0123  Vitals shown include unfiled device data.  Last Pain:  Vitals:   06/30/23 1745  TempSrc: Oral  PainSc:          Complications: No notable events documented.

## 2023-07-01 NOTE — Op Note (Signed)
 Preoperative diagnosis: Incisional hernia with obstruction  Postoperative diagnosis: Strangulated incisional hernia  Procedure: Robotic assisted laparoscopic strangulated incisional hernia repair with mesh Small bowel resection with anastomosis  Anesthesia: General  Surgeon: Dr. Hazle Quant  Wound Classification: Clean  Specimen: Small intestine  Complications: None  Estimated Blood Loss: 50 ml  Indications: Patient is a 78 y.o. female developed a ventral hernia. This was causing obstruction and repair was indicated to rule out strangulation  Findings: A complex fistulous incisional hernia measuring 11 x 7 cm Low perfusion of ICG green of the incarcerated loop of small bowel consistent with strangulation Repair achieved with closure of anterior fascia at the midline and a 20 x 15 cm Phaxis ST 4.   Adequate hemostasis  Description of procedure: The patient was brought to the operating room and general anesthesia was induced. A time-out was completed verifying correct patient, procedure, site, positioning, and implant(s) and/or special equipment prior to beginning this procedure. Antibiotics were administered prior to making the incision. SCDs placed. The anterior abdominal wall was prepped and draped in the standard sterile fashion.   Palmer's point chosen for entry.  Veress needle placed and abdomen insufflated to 15 mmHg without any dramatic increase in pressure.  Needle removed and optiview technique used to place 8 mm port at same point.  No injury noted during placement. Exparel was infused in a TAP block. Two additional ports, 8mm x2 along left lateral aspect placed.  Xi robot then docked into place.  Abundant amount of adhesions to the midline was identified.  A very difficult and time-consuming lysis of adhesion was needed to be done to be able to reduce the small intestine.  Upon reduction of the small intestine it looks dark and ICG green was given to the patient that  confirmed low perfusion of ICG to the small intestine. Hemostasis achieved throughout this portion.  Once all hernia contents reduced, there was noted to be a complex fistulous hernia.  I decided to proceed with small bowel resection.  I made a 5 cm incision in the midline.  Dissection was taken down and hernia sac was opened. A window was created by using a curved hemostat to separate the mesentery from the bowel at each resection margin. The mesentery was scored and serially divided with ligature. The bowel was divided with a cutting linear stapler at each resection margin and passed off the table as a specimen. The antimesenteric angles of the proximal and distal segments were then approximated with two sutures of 3-0 silk placed approximately 5 cm apart. Enterotomies were made at the antimesenteric borders and the cutting linear stapler inserted and fired. The lumen was inspected for hemostasis. The enterotomies were closed with a linear stapler.  The anastomosis was then inspected for patency and integrity. The mesenteric defect was closed with a running 3-0 Vicryl suture.  The skin was closed with staples I decided to proceed with robotic assisted laparoscopic repair of incisional hernia.  The robot was docked again.  Abdomen was insufflated to 8 mmHg and transfacial suture with 0 stratafix used to primarily close defect under minimal tension.  Phasix ST protected 20 cm x 15 cm mesh was placed within the abdominal cavity and secured to the abdominal wall centered over the defect using the 0 stratafix previously used to primarily close defect.  The mesh was then circumferentially sutured into the anterior abdominal wall using 2-0 Stratafix.  Any bleeding noted during this portion was no longer actively bleeding by end of securing  mesh and tightening the suture.    Robot was undocked. Abdomen then desufflated while camera within abdomen to ensure no signs of new bleed prior to removing camera and rest of  ports completely.  All skin incisions closed with runninrg staples..  I personally applied a negative pressure dressing (DME) to the midline incision covering an area of 4 x 2 cm (8 cm).  Patient was then successfully awakened and transferred to PACU in stable condition.  At the end of the procedure sponge and instrument counts were correct.

## 2023-07-01 NOTE — Plan of Care (Signed)

## 2023-07-02 ENCOUNTER — Inpatient Hospital Stay: Payer: Medicare Other

## 2023-07-02 ENCOUNTER — Encounter: Payer: Self-pay | Admitting: General Surgery

## 2023-07-02 DIAGNOSIS — M069 Rheumatoid arthritis, unspecified: Secondary | ICD-10-CM | POA: Diagnosis not present

## 2023-07-02 DIAGNOSIS — I5022 Chronic systolic (congestive) heart failure: Secondary | ICD-10-CM | POA: Diagnosis not present

## 2023-07-02 DIAGNOSIS — K56609 Unspecified intestinal obstruction, unspecified as to partial versus complete obstruction: Secondary | ICD-10-CM | POA: Diagnosis not present

## 2023-07-02 DIAGNOSIS — I10 Essential (primary) hypertension: Secondary | ICD-10-CM | POA: Diagnosis not present

## 2023-07-02 LAB — CBC
HCT: 39.4 % (ref 36.0–46.0)
Hemoglobin: 12.5 g/dL (ref 12.0–15.0)
MCH: 28.3 pg (ref 26.0–34.0)
MCHC: 31.7 g/dL (ref 30.0–36.0)
MCV: 89.3 fL (ref 80.0–100.0)
Platelets: 267 10*3/uL (ref 150–400)
RBC: 4.41 MIL/uL (ref 3.87–5.11)
RDW: 15.3 % (ref 11.5–15.5)
WBC: 8.8 10*3/uL (ref 4.0–10.5)
nRBC: 0 % (ref 0.0–0.2)

## 2023-07-02 LAB — BASIC METABOLIC PANEL
Anion gap: 9 (ref 5–15)
BUN: 14 mg/dL (ref 8–23)
CO2: 29 mmol/L (ref 22–32)
Calcium: 8.4 mg/dL — ABNORMAL LOW (ref 8.9–10.3)
Chloride: 102 mmol/L (ref 98–111)
Creatinine, Ser: 0.47 mg/dL (ref 0.44–1.00)
GFR, Estimated: >60 mL/min (ref >60)
Glucose, Bld: 177 mg/dL — ABNORMAL HIGH (ref 70–99)
Potassium: 3.6 mmol/L (ref 3.5–5.1)
Sodium: 140 mmol/L (ref 135–145)

## 2023-07-02 LAB — GLUCOSE, CAPILLARY
Glucose-Capillary: 131 mg/dL — ABNORMAL HIGH (ref 70–99)
Glucose-Capillary: 132 mg/dL — ABNORMAL HIGH (ref 70–99)
Glucose-Capillary: 134 mg/dL — ABNORMAL HIGH (ref 70–99)
Glucose-Capillary: 138 mg/dL — ABNORMAL HIGH (ref 70–99)
Glucose-Capillary: 160 mg/dL — ABNORMAL HIGH (ref 70–99)
Glucose-Capillary: 166 mg/dL — ABNORMAL HIGH (ref 70–99)
Glucose-Capillary: 218 mg/dL — ABNORMAL HIGH (ref 70–99)

## 2023-07-02 MED ORDER — IPRATROPIUM-ALBUTEROL 0.5-2.5 (3) MG/3ML IN SOLN: 3.0000 mL | Freq: Four times a day (QID) | RESPIRATORY_TRACT | Status: DC

## 2023-07-02 MED ORDER — INSULIN ASPART 100 UNIT/ML IJ SOLN
0.0000 [IU] | Freq: Three times a day (TID) | INTRAMUSCULAR | Status: DC
  Administered 2023-07-02: 1 [IU] via SUBCUTANEOUS
  Administered 2023-07-02: 3 [IU] via SUBCUTANEOUS
  Administered 2023-07-03 (×2): 1 [IU] via SUBCUTANEOUS
  Administered 2023-07-03 – 2023-07-04 (×2): 2 [IU] via SUBCUTANEOUS
  Administered 2023-07-04: 1 [IU] via SUBCUTANEOUS
  Filled 2023-07-02 (×7): qty 1

## 2023-07-02 MED ORDER — CLONAZEPAM 0.25 MG PO TBDP
0.2500 mg | ORAL_TABLET | Freq: Two times a day (BID) | ORAL | Status: DC
  Administered 2023-07-02: 0.25 mg via ORAL
  Filled 2023-07-02 (×2): qty 1

## 2023-07-02 MED ORDER — ACETAMINOPHEN 160 MG/5ML PO SOLN
650.0000 mg | Freq: Four times a day (QID) | ORAL | Status: DC | PRN
  Administered 2023-07-02 – 2023-07-03 (×3): 650 mg via ORAL
  Filled 2023-07-02 (×4): qty 20.3

## 2023-07-02 MED ORDER — DM-GUAIFENESIN ER 30-600 MG PO TB12
1.0000 | ORAL_TABLET | Freq: Two times a day (BID) | ORAL | Status: DC
  Administered 2023-07-03 – 2023-07-04 (×3): 1 via ORAL
  Filled 2023-07-02 (×3): qty 1

## 2023-07-02 MED ORDER — CLOPIDOGREL BISULFATE 75 MG PO TABS
75.0000 mg | ORAL_TABLET | Freq: Every day | ORAL | Status: DC
  Administered 2023-07-03 – 2023-07-04 (×2): 75 mg via ORAL
  Filled 2023-07-02 (×2): qty 1

## 2023-07-02 MED ORDER — FUROSEMIDE 10 MG/ML IJ SOLN
40.0000 mg | Freq: Once | INTRAMUSCULAR | Status: AC
  Administered 2023-07-02: 40 mg via INTRAVENOUS
  Filled 2023-07-02: qty 4

## 2023-07-02 MED ORDER — IPRATROPIUM-ALBUTEROL 0.5-2.5 (3) MG/3ML IN SOLN
3.0000 mL | Freq: Four times a day (QID) | RESPIRATORY_TRACT | Status: DC | PRN
  Administered 2023-07-02 – 2023-07-04 (×2): 3 mL via RESPIRATORY_TRACT
  Filled 2023-07-02 (×3): qty 3

## 2023-07-02 MED ORDER — ACETAMINOPHEN 160 MG/5ML PO SOLN
650.0000 mg | Freq: Four times a day (QID) | ORAL | Status: DC | PRN
  Filled 2023-07-02: qty 20.3

## 2023-07-02 MED ORDER — METOPROLOL TARTRATE 25 MG PO TABS
25.0000 mg | ORAL_TABLET | Freq: Two times a day (BID) | ORAL | Status: DC
  Administered 2023-07-02 – 2023-07-04 (×4): 25 mg via ORAL
  Filled 2023-07-02 (×4): qty 1

## 2023-07-02 MED ORDER — INSULIN ASPART 100 UNIT/ML IJ SOLN: 0.0000 [IU] | Freq: Every day | INTRAMUSCULAR | Status: DC

## 2023-07-02 NOTE — Evaluation (Signed)
 Occupational Therapy Evaluation Patient Details Name: Tammy Ashley MRN: [id number] DOB: April 22, 1946 Today's Date: 07/02/2023   History of Present Illness   Pt is a 78 y/o F admitted on 06/30/23 after presenting with c/o abdominal pain. Abdominal CT concerning for SBO vs enteritis with complex ventral abdominal wall hernia. Pt is s/p small bowel resection & repair of incisional hernia. PMH: HFpEF, Obstructive CAD, pheochromocytoma s/p R adrenalectomy, RA, DM2, mild pulmonary HTN, endometrial CA (2010), HLD     Clinical Impressions Pt was seen for OT evaluation this date. Prior to hospital admission, pt was living at home alone and had intermittent supervision from her son for tub transfers, otherwise MOD I/IND with ADLs and rollator use for household mobility.   Pt presents to acute OT demonstrating impaired ADL performance and functional mobility 2/2 weakness (See OT problem list for additional functional deficits). Pt currently requires Max A for logroll and sidelying to sit at EOB with increased time, effort, verb cues for hand placement and difficulty pushing trunk upright. SBA to maintain seated balance and Min A x1 plus SBA x1 from NT for STS and SPT to [facility] and from [facility] to recliner via HHA x1. Pt able to stand with UE support on EOB with CGA while NT providing total/max A for peri-care after BM. Pt would benefit from skilled OT services to address noted impairments and functional limitations (see below for any additional details) in order to maximize safety and independence while minimizing falls risk and caregiver burden. Do anticipate the need for follow up OT services upon acute hospital DC.      If plan is discharge home, recommend the following:   A lot of help with walking and/or transfers;A lot of help with bathing/dressing/bathroom;Assistance with cooking/housework;Assist for transportation;Help with stairs or ramp for entrance     Functional Status Assessment   Patient  has had a recent decline in their functional status and demonstrates the ability to make significant improvements in function in a reasonable and predictable amount of time.     Equipment Recommendations   BSC/3in1;Hospital bed     Recommendations for Other Services         Precautions/Restrictions   Precautions Precaution/Restrictions Comments: log roll for comfort, abdominal wound vac Restrictions Weight Bearing Restrictions Per Provider Order: No     Mobility Bed Mobility Overal bed mobility: Needs Assistance Bed Mobility: Rolling, Sidelying to Sit Rolling: Min assist, Used rails Sidelying to sit: Max assist, HOB elevated, Used rails       General bed mobility comments: Min A to roll into R sidelying then Max A to reach seated position at EOB with increased time/effort and difficulty pushing into upright position from bed rail    Transfers Overall transfer level: Needs assistance Equipment used: 2 person hand held assist, 1 person hand held assist Transfers: Sit to/from Stand, Bed to chair/wheelchair/BSC Sit to Stand: Min assist     Step pivot transfers: Min assist, +2 safety/equipment     General transfer comment: Min A for STS from EOB via HHA and Min A x1-2 for safety for SPT to [facility] and to recliner      Balance Overall balance assessment: Needs assistance Sitting-balance support: Feet supported Sitting balance-Leahy Scale: Fair Sitting balance - Comments: SBA for seated balance at EOB   Standing balance support: Bilateral upper extremity supported, During functional activity Standing balance-Leahy Scale: Poor Standing balance comment: Min A and UE support to maintain standing balance  ADL either performed or assessed with clinical judgement   ADL Overall ADL's : Needs assistance/impaired                         Toilet Transfer: BSC/3in1;Minimal assistance Toilet Transfer Details (indicate cue type  and reason): Min A x1 and SBA x1 for safety to transfer to [facility] from EOB and BSC to recliner Toileting- Clothing Manipulation and Hygiene: Total assistance;Maximal assistance;Sit to/from stand Toileting - Clothing Manipulation Details (indicate cue type and reason): peri-care in standing             Vision         Perception         Praxis         Pertinent Vitals/Pain Pain Assessment Pain Assessment: 0-10 Pain Score: 10-Worst pain ever Pain Location: abdomen Pain Descriptors / Indicators: Grimacing, Guarding, Discomfort Pain Intervention(s): Monitored during session, Repositioned, Patient requesting pain meds-RN notified     Extremity/Trunk Assessment Upper Extremity Assessment Upper Extremity Assessment: Generalized weakness   Lower Extremity Assessment Lower Extremity Assessment: Generalized weakness   Cervical / Trunk Assessment Cervical / Trunk Assessment:  (abdominal wound vac, NG tube)   Communication Communication Communication: No apparent difficulties Factors Affecting Communication: Hearing impaired   Cognition Arousal: Alert Behavior During Therapy: Anxious                                   Following commands impaired: Follows one step commands with increased time, Only follows one step commands consistently     Cueing  General Comments   Cueing Techniques: Verbal cues;Gestural cues;Visual cues;Tactile cues      Exercises     Shoulder Instructions      Home Living Family/patient expects to be discharged to:: Private residence Living Arrangements: Alone Available Help at Discharge: Family;Available PRN/intermittently Type of Home: House Home Access: Stairs to enter [employer] of Steps: 5 Entrance Stairs-Rails: Right;Left Home Layout: One level     Bathroom Shower/Tub: Tub/shower unit         Home Equipment: Rollator (4 wheels);Wheelchair - manual;Shower seat          Prior Functioning/Environment                Mobility Comments: Ambulatory with rollator in the home, w/c outside of the home for long distances. Denies falls. Kids provide assistance with transportation, meals. ADLs Comments: Son provides assistance PRN (supervision always) for transfers in/out of tub but pt able to wash herself, dress without assistance.    OT Problem List: Decreased strength;Pain;Decreased activity tolerance;Impaired balance (sitting and/or standing)   OT Treatment/Interventions: Self-care/ADL training;Therapeutic exercise;Therapeutic activities;DME and/or AE instruction;Energy conservation;Balance training      OT Goals(Current goals can be found in the care plan section)   Acute Rehab OT Goals Patient Stated Goal: improve pain OT Goal Formulation: With patient/family Time For Goal Achievement: 07/16/23 Potential to Achieve Goals: Good ADL Goals Pt Will Perform Lower Body Bathing: sitting/lateral leans;with adaptive equipment;with min assist Pt Will Perform Lower Body Dressing: with min assist;sitting/lateral leans;with adaptive equipment Pt Will Transfer to Toilet: with contact guard assist;bedside commode;ambulating;with supervision Pt Will Perform Toileting - Clothing Manipulation and hygiene: with min assist;sitting/lateral leans;sit to/from stand   OT Frequency:  Min 1X/week    Co-evaluation              AM-PAC OT "6 Clicks" Daily Activity  Outcome Measure Help from another person eating meals?: None Help from another person taking care of personal grooming?: None Help from another person toileting, which includes using toliet, bedpan, or urinal?: A Lot Help from another person bathing (including washing, rinsing, drying)?: A Lot Help from another person to put on and taking off regular upper body clothing?: A Little Help from another person to put on and taking off regular lower body clothing?: A Lot 6 Click Score: 17   End of Session Equipment Utilized During  Treatment: Gait belt;Oxygen Nurse Communication: Mobility status  Activity Tolerance: Patient tolerated treatment well;Patient limited by pain Patient left: in chair;with call bell/phone within reach;with chair alarm set;with family/visitor present  OT Visit Diagnosis: Other abnormalities of gait and mobility (R26.89);Muscle weakness (generalized) (M62.81);Pain Pain - Right/Left: Left Pain - part of body:  (abdomen-L sided)                Time: 1005-1033 OT Time Calculation (min): 28 min Charges:  OT General Charges $OT Visit: 1 Visit OT Evaluation $OT Eval Moderate Complexity: 1 Mod OT Treatments $Self Care/Home Management : 8-22 mins  Sayyid Harewood, OTR/L 07/02/23, 12:20 PM  Deriana Vanderhoef E Remonia Otte 07/02/2023, 12:19 PM

## 2023-07-02 NOTE — Progress Notes (Signed)
 Physical Therapy Treatment Patient Details Name: Tammy Ashley MRN: [id number] DOB: 1945/06/21 Today's Date: 07/02/2023   History of Present Illness Pt is a 78 y/o F admitted on 06/30/23 after presenting with c/o abdominal pain. Abdominal CT concerning for SBO vs enteritis with complex ventral abdominal wall hernia. Pt is s/p small bowel resection & repair of incisional hernia. PMH: HFpEF, Obstructive CAD, pheochromocytoma s/p R adrenalectomy, RA, DM2, mild pulmonary HTN, endometrial CA (2010), HLD    PT Comments  Pt is making limited progress towards goals. Pt reports she's had very little sleep due to constant coughing. Reports she doesn't like the recliner as it makes her bottom sore. Agreeable to sit at EOB and to perform breathing there-ex. Able to stand and take side steps at bedside, however unable to further tolerate mobility efforts due to fatigue. Encouraged to continue mobility efforts as able with RN staff. Will continue to see to maximize functional improvement.    If plan is discharge home, recommend the following: A lot of help with walking and/or transfers;A lot of help with bathing/dressing/bathroom;Assist for transportation;Assistance with cooking/housework;Help with stairs or ramp for entrance   Can travel by private vehicle     No  Equipment Recommendations   (TBD)    Recommendations for Other Services       Precautions / Restrictions Precautions Precautions: Fall Recall of Precautions/Restrictions: Intact Precaution/Restrictions Comments: log roll for comfort, abdominal wound vac Restrictions Weight Bearing Restrictions Per Provider Order: No     Mobility  Bed Mobility Overal bed mobility: Needs Assistance Bed Mobility: Rolling, Sidelying to Sit   Sidelying to sit: Max assist       General bed mobility comments: needs heavy assist for B UE management and trunkal elevation to come to sitting at EOB. Needs max assist for returning supine and  repositioning back in bed    Transfers Overall transfer level: Needs assistance Equipment used: 1 person hand held assist Transfers: Sit to/from Stand Sit to Stand: Mod assist           General transfer comment: needs cues for sequencing including leaning forward to promote ant weight shift in standing. Once standing, wide BOS noted    Ambulation/Gait Ambulation/Gait assistance: Min assist Gait Distance (Feet): 3 Feet Assistive device: 1 person hand held assist Gait Pattern/deviations: Step-to pattern       General Gait Details: able to side step up towards HOB. Pt fatigues quickly and unable to further tolerate ambulation away from bed. O2 sats at 88% on 4L and HR at 120s with exertion   Stairs             Wheelchair Mobility     Tilt Bed    Modified Rankin (Stroke Patients Only)       Balance Overall balance assessment: Needs assistance Sitting-balance support: Feet supported Sitting balance-Leahy Scale: Fair     Standing balance support: Bilateral upper extremity supported, During functional activity Standing balance-Leahy Scale: Poor                              Communication Communication Communication: No apparent difficulties Factors Affecting Communication: Hearing impaired  Cognition Arousal: Alert Behavior During Therapy: Anxious   PT - Cognitive impairments: No apparent impairments                       PT - Cognition Comments: pleasant and agreeable to limited session Following commands: Intact  Cueing Cueing Techniques: Verbal cues, Gestural cues, Visual cues, Tactile cues  Exercises Other Exercises Other Exercises: seated ther-ex performed including B LE LAQ and IS only reaching approx despite cues. O2 sats improve to 92% with breathing ther-ex    General Comments        Pertinent Vitals/Pain Pain Assessment Pain Assessment: Faces Faces Pain Scale: Hurts little more Pain Location:  abdomen Pain Descriptors / Indicators: Grimacing, Guarding, Discomfort Pain Intervention(s): Limited activity within patient's tolerance, Repositioned    Home Living Family/patient expects to be discharged to:: Private residence Living Arrangements: Alone Available Help at Discharge: Family;Available PRN/intermittently Type of Home: House Home Access: Stairs to enter Entrance Stairs-Rails: Doctor, general practice of Steps: 5   Home Layout: One level Home Equipment: Rollator (4 wheels);Wheelchair - manual;Shower seat      Prior Function            PT Goals (current goals can now be found in the care plan section) Acute Rehab PT Goals Patient Stated Goal: decreased pain, get better PT Goal Formulation: With patient Time For Goal Achievement: 07/15/23 Potential to Achieve Goals: Good Progress towards PT goals: Progressing toward goals    Frequency    Min 1X/week      PT Plan      Co-evaluation              AM-PAC PT "6 Clicks" Mobility   Outcome Measure  Help needed turning from your back to your side while in a flat bed without using bedrails?: A Lot Help needed moving from lying on your back to sitting on the side of a flat bed without using bedrails?: A Lot Help needed moving to and from a bed to a chair (including a wheelchair)?: A Lot Help needed standing up from a chair using your arms (e.g., wheelchair or bedside chair)?: A Lot Help needed to walk in hospital room?: Total Help needed climbing 3-5 steps with a railing? : Total 6 Click Score: 10    End of Session Equipment Utilized During Treatment: Oxygen Activity Tolerance: Patient limited by fatigue Patient left: in bed;with bed alarm set Nurse Communication: Mobility status PT Visit Diagnosis: Pain;Unsteadiness on feet (R26.81);Muscle weakness (generalized) (M62.81);Difficulty in walking, not elsewhere classified (R26.2)     Time: 1191-4782 PT Time Calculation (min) (ACUTE ONLY): 23  min  Charges:    $Therapeutic Exercise: 23-37 mins PT General Charges $$ ACUTE PT VISIT: 1 Visit                     Elizabeth Palau, PT, DPT, GCS [phone]    Tammy Ashley 07/02/2023, 3:13 PM

## 2023-07-02 NOTE — Plan of Care (Signed)

## 2023-07-02 NOTE — Progress Notes (Signed)
 1      PROGRESS NOTE    Tammy Ashley  UJW:[id number] DOB: August 19, 1945 DOA: 06/30/2023 PCP: Adrian Prince, MD    Brief Narrative:  78 y.o. female with medical history significant of HFpEF with last EF of 25-30%, obstructive CAD, pheochromocytoma s/p right adrenalectomy, rheumatoid arthritis, type 2 diabetes, mild pulmonary hypertension, endometrial cancer (2010), hyperlipidemia, who presents to the ED due to abdominal pain   2/15: Surgery consult for strangulated incisional hernia  2/16: Robotic assisted laparoscopic strangulated incisional hernia repair with mesh Small bowel resection with anastomosis 2/17: diet advanced to full liquid, CXR, Lasix, mucinex, duoneb   Assessment & Plan:   Principal Problem:   SBO (small bowel obstruction) (HCC) Active Problems:   Chronic HFrEF (heart failure with reduced ejection fraction) (HCC)   Essential hypertension   Arthritis, rheumatoid (HCC)   Coronary artery disease   Type 2 diabetes mellitus (HCC)  * SBO (small bowel obstruction) (HCC) Strangulated incisional hernia Patient is presenting with several day history of intractable nausea/vomiting with mild abdominal pain with findings concerning for SBO versus inflammation.  Given multiple previous abdominal surgeries and complicated ventral hernia, she is admitted for strangulated incisional hernia - General Surgery following - Status post robotic assisted laparoscopic incisional hernia repair yesterday evening on 2/15.  POD 2 - tolerated CLD, advanced to FLD, she had several BM - Further management per surgical team   Chronic HFrEF (heart failure with reduced ejection fraction) (HCC) Recently diagnosed HFrEF with last EF of 25-30%, currently established with heart failure clinic followed by Dr. Shirlee Latch.  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  Patient appears hypovolemic, in the setting of vomiting and SBO. - change lopressor to PO metoprolol now that she is taking PO - Hold  home GDMT and diuretics - Daily weights - Lasix 40 mg IV once, added Mucinex, duoneb for cough. Cxr shows small pleural effusion   Essential hypertension Previous history of hypertension, with chronic hypotension after being started on GDMT for HFrEF.  Per cardiology note, SBP generally between 90-100 now.  Blood pressure currently within that range.   - Hold home regimen given n.p.o. with NG tube -Metoprolol   Type 2 diabetes mellitus (HCC) History of type 2 diabetes with no recent A1c available in chart. Currently managed with Farxiga and metformin.   - SSI, sensitive - A1c 7 - Hold home regimen   Coronary artery disease Recent left heart cath with chronically occluded LAD, not amenable to PCI or CABG.   - Resumed Plavix.  Okay by surgery - Hold home atorvastatin pending bowel function return   Arthritis, rheumatoid (HCC) Currently managed with Orencia and leflunomide.    - Hold home regimen at this time   Anxiety Klonopin bid    DVT prophylaxis: SCDs SCDs Start: 06/30/23 1735     Code Status: Full code Family Communication: Daughter updated at bedside Disposition Plan: Possible discharge in next 1-2 days depending on clinical condition and postop recovery.  Will need SNF at discharge   Consultants:  Surgery  Procedures:  Robotic assisted laparoscopic strangulated incisional hernia repair with mesh Small bowel resection with anastomosis    Subjective:  She is SOB, cough +, some anxiety. Daughter at bedside  Objective: Vitals:   07/02/23 0757 07/02/23 1343 07/02/23 1416 07/02/23 1745  BP: (!) 100/53 103/67 (!) 119/53 109/66  Pulse: (!) 121 (!) 109 (!) 111 (!) 118  Resp: 18 18 19 18   Temp: 98.4 F (36.9 C) 98.6 F (37 C)  98.1 F (36.7 C) 98.1 F (36.7 C)  TempSrc: Oral Oral Oral Oral  SpO2: 94% 96% 92% 95%  Weight:      Height:        Intake/Output Summary (Last 24 hours) at 07/02/2023 1746 Last data filed at 07/02/2023 1041 Gross per 24 hour   Intake 120 ml  Output 850 ml  Net -730 ml   Filed Weights   06/30/23 1314  Weight: 69.4 kg    Examination:  General exam: Appears calm and comfortable  Respiratory system: rales at bases Cardiovascular system: S1 & S2 heard, RRR. No JVD, murmurs, rubs, gallops or clicks. No pedal edema. Gastrointestinal system: Abdomen is nondistended, soft and surgical site/staples/dressing in place Central nervous system: Alert and oriented. No focal neurological deficits. Extremities: Symmetric 5 x 5 power. Skin: No rashes, lesions or ulcers Psychiatry: Judgement and insight appear normal. Mood & affect appropriate.     Data Reviewed: I have personally reviewed following labs and imaging studies  CBC: Recent Labs  Lab 06/30/23 1316 07/01/23 0338 07/02/23 0544  WBC 8.2 8.2 8.8  HGB 16.0* 13.2 12.5  HCT 49.3* 41.3 39.4  MCV 87.9 88.8 89.3  PLT 337 302 267   Basic Metabolic Panel: Recent Labs  Lab 06/30/23 1316 07/01/23 0338 07/02/23 0544  NA 137 140 140  K 4.6 4.0 3.6  CL 100 104 102  CO2 23 25 29   GLUCOSE 169* 190* 177*  BUN 21 21 14   CREATININE 0.59 0.57 0.47  CALCIUM 9.2 8.7* 8.4*  MG  --  1.8  --   PHOS  --  5.1*  --    GFR: Estimated Creatinine Clearance: 51.2 mL/min (by C-G formula based on SCr of 0.47 mg/dL). Liver Function Tests: Recent Labs  Lab 06/30/23 1316 07/01/23 0338  AST 23 18  ALT 10 16  ALKPHOS 41 39  BILITOT 1.7* 0.9  PROT 6.8 6.1*  ALBUMIN 3.7 3.4*   Recent Labs  Lab 06/30/23 1316  LIPASE 19    HbA1C: Recent Labs    07/01/23 0338  HGBA1C 7.0*   CBG: Recent Labs  Lab 07/02/23 0052 07/02/23 0558 07/02/23 0756 07/02/23 1218 07/02/23 1628  GLUCAP 134* 166* 160* 218* 131*     No results found for this or any previous visit (from the past 240 hours).       Radiology Studies: DG Chest Port 1 View Result Date: 07/02/2023 CLINICAL DATA:  Shortness of breath. EXAM: PORTABLE CHEST 1 VIEW COMPARISON:  08/27/2012. FINDINGS:  Patient is rotated. Trachea is midline. Heart is enlarged, stable. Thoracic aorta is calcified. Lungs are low in volume with streaky scarring in the left perihilar region and right lung base. Small left pleural effusion. IMPRESSION: Small left pleural effusion. Electronically Signed   By: Leanna Battles M.D.   On: 07/02/2023 16:07   DG Abd Portable 1V-Small Bowel Obstruction Protocol-initial, 8 hr delay Result Date: 07/01/2023 CLINICAL DATA:  Small-bowel obstruction 8 hour delay. EXAM: PORTABLE ABDOMEN - 1 VIEW COMPARISON:  X-ray abdomen 07/01/2023, CT abdomen pelvis 06/30/2023 FINDINGS: Enteric tube courses below the hemidiaphragm with tip and side port overlying the expected region of the gastric lumen. The enteric tube is coiled once within the gastric lumen. PO contrast reaches the rectum. The bowel gas pattern is normal. No radio-opaque calculi or other significant radiographic abnormality are seen. Severe degenerative changes of the right hip. IMPRESSION: 1. Enteric tube in good position; however, could be retracted by 15 cm. 2. PO contrast reaches the  rectum. 3. Nonobstructive bowel gas pattern. 4. Severe right hip degenerative changes. Electronically Signed   By: Tish Frederickson M.D.   On: 07/01/2023 20:21   DG Abd Portable 1V Result Date: 07/01/2023 CLINICAL DATA:  Check gastric catheter placement EXAM: PORTABLE ABDOMEN - 1 VIEW COMPARISON:  None Available. FINDINGS: Gastric catheter is coiled within the stomach. No free air is noted. No obstructive changes are noted. Postsurgical changes are seen. IMPRESSION: Gastric catheter within the stomach. Electronically Signed   By: Alcide Clever M.D.   On: 07/01/2023 02:39   DG C-Arm 1-60 Min-No Report Result Date: 07/01/2023 Fluoroscopy was utilized by the requesting physician.  No radiographic interpretation.        Scheduled Meds:  [START ON 07/03/2023] clopidogrel  75 mg Oral Daily   dextromethorphan-guaiFENesin  1 tablet Oral BID   insulin  aspart  0-5 Units Subcutaneous QHS   insulin aspart  0-9 Units Subcutaneous TID WC   ipratropium-albuterol  3 mL Nebulization Q6H   metoprolol tartrate  5 mg Intravenous Q12H   sodium chloride flush  3 mL Intravenous Q12H   Continuous Infusions:   LOS: 2 days    Time spent: 35 minutes    Delfino Lovett, MD [facility] Pager [phone]  If 7PM-7AM, please contact night-coverage  07/02/2023, 5:46 PM

## 2023-07-02 NOTE — Progress Notes (Signed)
 Patient ID: Tammy Ashley, female   DOB: 12-27-1945, 78 y.o.   MRN: [id number]     SURGICAL PROGRESS NOTE   Hospital Day(s): 2.   Interval History: Patient seen and examined, no acute events or new complaints overnight. Patient reports she had a rough night because of a lot of bowel movements.  She is having some incisional pain that also make it difficult for her to move to be clean.  Denies any nausea or vomiting.  She was able to tolerate liquids.  Vital signs in last 24 hours: [min-max] current  Temp:  [97.7 F (36.5 C)-98.4 F (36.9 C)] 98 F (36.7 C) (02/17 0506) Pulse Rate:  [101-117] 117 (02/17 0506) Resp:  [18] 18 (02/17 0506) BP: (108-115)/(50-61) 113/61 (02/17 0506) SpO2:  [94 %-96 %] 96 % (02/17 0506)     Height: 5' (152.4 cm) Weight: 69.4 kg BMI (Calculated): 29.88   Physical Exam:  Constitutional: alert, cooperative and no distress  Respiratory: breathing non-labored at rest  Cardiovascular: regular rate and sinus rhythm  Gastrointestinal: soft, mild-tender, and non-distended  Labs:     Latest Ref Rng & Units 07/02/2023    5:44 AM 07/01/2023    3:38 AM 06/30/2023    1:16 PM  CBC  WBC 4.0 - 10.5 K/uL 8.8  8.2  8.2   Hemoglobin 12.0 - 15.0 g/dL 62.9  52.8  41.3   Hematocrit 36.0 - 46.0 % 39.4  41.3  49.3   Platelets 150 - 400 K/uL 267  302  337       Latest Ref Rng & Units 07/02/2023    5:44 AM 07/01/2023    3:38 AM 06/30/2023    1:16 PM  CMP  Glucose 70 - 99 mg/dL 244  010  272   BUN 8 - 23 mg/dL 14  21  21    Creatinine 0.44 - 1.00 mg/dL 5.36  6.44  0.34   Sodium 135 - 145 mmol/L 140  140  137   Potassium 3.5 - 5.1 mmol/L 3.6  4.0  4.6   Chloride 98 - 111 mmol/L 102  104  100   CO2 22 - 32 mmol/L 29  25  23    Calcium 8.9 - 10.3 mg/dL 8.4  8.7  9.2   Total Protein 6.5 - 8.1 g/dL  6.1  6.8   Total Bilirubin 0.0 - 1.2 mg/dL  0.9  1.7   Alkaline Phos 38 - 126 U/L  39  41   AST 15 - 41 U/L  18  23   ALT 0 - 44 U/L  16  10     Imaging studies: No new  pertinent imaging studies   Assessment/Plan:  78 y.o. female with strangulating incisional hernia 2 Day Post-Op s/p small bowel resection and repair of incisional hernia, complicated by pertinent comorbidities including uncontrolled CHF, coronary artery disease, diabetes.   -Patient currently stable without clinical toleration from cardiac standpoint -Very high risk for cardiac deterioration due to recent diagnosis of uncontrolled CHF -BNP over 700 on admission -Abdomen is soft with suspected tenderness. -Resolving SBO.  Patient had multiple bowel movements.  No nausea or vomiting.  Patient tolerated acute liquid diet -Will advance diet to full liquid diet -Encourage patient to ambulate  Gae Gallop, MD

## 2023-07-02 NOTE — Inpatient Diabetes Management (Addendum)
 Inpatient Diabetes Program Recommendations  AACE/ADA: New Consensus Statement on Inpatient Glycemic Control   Target Ranges:  Prepandial:   less than 140 mg/dL      Peak postprandial:   less than 180 mg/dL (1-2 hours)      Critically ill patients:  140 - 180 mg/dL    Latest Reference Range & Units 07/01/23 09:23 07/01/23 13:04 07/01/23 17:06 07/01/23 19:35 07/02/23 00:52 07/02/23 05:58 07/02/23 07:56  Glucose-Capillary 70 - 99 mg/dL 782 (H) 956 (H) 213 (H) 137 (H) 134 (H) 166 (H) 160 (H)    Latest Reference Range & Units 07/01/23 03:38  Hemoglobin A1C 4.8 - 5.6 % 7.0 (H)   Review of Glycemic Control  Diabetes history: DM2 Outpatient Diabetes medications: Farxiga 10 mg QAM, Metformin 500 mg BID Current orders for Inpatient glycemic control: Novolog 0-9 units TID with meals, Novolog 0-5 units QHS  Inpatient Diabetes Program Recommendations:    HbgA1C:  A1C 7.0% on 07/01/23 indicating an average glucose of 154 mg/dl over the past 2-3 months.  Outpatient DM: Would recommend to continue Metformin and Farxiga outpatient for DM control (as long as no contraindications to use) and follow up with Dr. Evlyn Kanner regarding DM control.  NOTE: Patient admitted 06/30/23 with small bowel obstruction, chronic CHF, with history of hypertension and DM2. Per chart patient's PCP is Dr. Evlyn Kanner (Endocrinologist) and patient takes [place] and Metformin outpatient for DM control.   Addendum 07/02/23@14 :00-Noted diabetes coordinator consult for CGM.  Spoke with patient and her son at bedside regarding DM. Patient confirms that she sees Dr. Evlyn Kanner for PCP and DM management. Patient reports she checks glucose with finger sticks at home and CBGs and A1C are usually good and DM is overall well controlled. Patient confirms that she is taking Metformin 500 mg BID and Farxiga 10 mg daily (she was recently started on Farxiga 10 mg QAM by cardiology for CHF).  Inquired about using continuous glucose monitoring (CGM) sensor and  patient and her son both say they would not want to use a CGM and will continue to do finger stick glucose monitoring.  Encouraged patient to continue to check glucose at home and take DM medications as prescribed at discharge. Encouraged patient to make sure Dr. Evlyn Kanner is aware that she was started on the Farxiga by cardiology and to reach out to him if she has an issues with glucose after discharge. Patient and son appreciative of visit and have no questions at this time.  Thanks, Orlando Penner, RN, MSN, CDCES Diabetes Coordinator Inpatient Diabetes Program [phone] (Team Pager from 8am to 5pm)

## 2023-07-03 DIAGNOSIS — I5022 Chronic systolic (congestive) heart failure: Secondary | ICD-10-CM | POA: Diagnosis not present

## 2023-07-03 DIAGNOSIS — I10 Essential (primary) hypertension: Secondary | ICD-10-CM | POA: Diagnosis not present

## 2023-07-03 DIAGNOSIS — K56609 Unspecified intestinal obstruction, unspecified as to partial versus complete obstruction: Secondary | ICD-10-CM | POA: Diagnosis not present

## 2023-07-03 DIAGNOSIS — M069 Rheumatoid arthritis, unspecified: Secondary | ICD-10-CM | POA: Diagnosis not present

## 2023-07-03 LAB — BASIC METABOLIC PANEL
Anion gap: 11 (ref 5–15)
BUN: 15 mg/dL (ref 8–23)
CO2: 30 mmol/L (ref 22–32)
Calcium: 8.4 mg/dL — ABNORMAL LOW (ref 8.9–10.3)
Chloride: 100 mmol/L (ref 98–111)
Creatinine, Ser: 0.57 mg/dL (ref 0.44–1.00)
GFR, Estimated: >60 mL/min (ref >60)
Glucose, Bld: 140 mg/dL — ABNORMAL HIGH (ref 70–99)
Potassium: 3.3 mmol/L — ABNORMAL LOW (ref 3.5–5.1)
Sodium: 141 mmol/L (ref 135–145)

## 2023-07-03 LAB — SURGICAL PATHOLOGY

## 2023-07-03 LAB — CBC
HCT: 37.2 % (ref 36.0–46.0)
Hemoglobin: 11.9 g/dL — ABNORMAL LOW (ref 12.0–15.0)
MCH: 28.6 pg (ref 26.0–34.0)
MCHC: 32 g/dL (ref 30.0–36.0)
MCV: 89.4 fL (ref 80.0–100.0)
Platelets: 258 10*3/uL (ref 150–400)
RBC: 4.16 MIL/uL (ref 3.87–5.11)
RDW: 15 % (ref 11.5–15.5)
WBC: 7.9 10*3/uL (ref 4.0–10.5)
nRBC: 0 % (ref 0.0–0.2)

## 2023-07-03 LAB — GLUCOSE, CAPILLARY
Glucose-Capillary: 130 mg/dL — ABNORMAL HIGH (ref 70–99)
Glucose-Capillary: 197 mg/dL — ABNORMAL HIGH (ref 70–99)
Glucose-Capillary: 140 mg/dL — ABNORMAL HIGH (ref 70–99)
Glucose-Capillary: 134 mg/dL — ABNORMAL HIGH (ref 70–99)

## 2023-07-03 MED ORDER — MAGNESIUM SULFATE 2 GM/50ML IV SOLN
2.0000 g | Freq: Once | INTRAVENOUS | Status: AC
  Administered 2023-07-03: 2 g via INTRAVENOUS
  Filled 2023-07-03: qty 50

## 2023-07-03 MED ORDER — POTASSIUM CHLORIDE CRYS ER 20 MEQ PO TBCR
40.0000 meq | EXTENDED_RELEASE_TABLET | Freq: Once | ORAL | Status: AC
  Administered 2023-07-03: 40 meq via ORAL
  Filled 2023-07-03: qty 2

## 2023-07-03 MED ORDER — FUROSEMIDE 10 MG/ML IJ SOLN
40.0000 mg | Freq: Once | INTRAMUSCULAR | Status: AC
  Administered 2023-07-03: 40 mg via INTRAVENOUS
  Filled 2023-07-03: qty 4

## 2023-07-03 NOTE — Progress Notes (Signed)
 Occupational Therapy Treatment Patient Details Name: Tammy Ashley MRN: [id number] DOB: 1945/07/09 Today's Date: 07/03/2023   History of present illness Pt is a 78 y/o F admitted on 06/30/23 after presenting with c/o abdominal pain. Abdominal CT concerning for SBO vs enteritis with complex ventral abdominal wall hernia. Pt is s/p small bowel resection & repair of incisional hernia. PMH: HFpEF, Obstructive CAD, pheochromocytoma s/p R adrenalectomy, RA, DM2, mild pulmonary HTN, endometrial CA (2010), HLD   OT comments  Pt is supine in bed on arrival. Pleasant and agreeable to OT session. She mentions pain as a minimal stretch to her abdomen. Pt performed log roll technique to get OOB with SUP and bed rail use. LB dressing to don mesh panties with SBA and increased time with cueing for figure four to reduce abdominal discomfort. CGA for STS from EOB to RW and mobility to the bathroom with CGA/SBA and use of grab bar for toilet transfer. Pt handed off for treatment with PT at end of session. Updated DC recommendation to [facility] therapy and will continue to benefit from acute OT.       If plan is discharge home, recommend the following:  Assistance with cooking/housework;Assist for transportation;Help with stairs or ramp for entrance;A little help with bathing/dressing/bathroom;A little help with walking and/or transfers   Equipment Recommendations  None recommended by OT    Recommendations for Other Services      Precautions / Restrictions Precautions Recall of Precautions/Restrictions: Intact Precaution/Restrictions Comments: log roll for comfort Restrictions Weight Bearing Restrictions Per Provider Order: No       Mobility Bed Mobility Overal bed mobility: Needs Assistance Bed Mobility: Rolling, Sidelying to Sit Rolling: Used rails, Supervision Sidelying to sit: Used rails, HOB elevated, Supervision       General bed mobility comments: SUP for bed mobility today with no physical  assist provided or cueing for log roll    Transfers Overall transfer level: Needs assistance Equipment used: Rolling walker (2 wheels) Transfers: Sit to/from Stand Sit to Stand: Supervision, Contact guard assist           General transfer comment: CGA/SUP for STS from EOB to RW and able to ambulate around the bed to the bathroom with handoff to PT     Balance Overall balance assessment: Needs assistance Sitting-balance support: Feet supported Sitting balance-Leahy Scale: Normal Sitting balance - Comments: steady during LB dressing EOB   Standing balance support: Bilateral upper extremity supported, During functional activity Standing balance-Leahy Scale: Fair Standing balance comment: no LOB while ambluating-steady with RW use in room                           ADL either performed or assessed with clinical judgement   ADL Overall ADL's : Needs assistance/impaired                     Lower Body Dressing: Supervision/safety;Sitting/lateral leans;Sit to/from stand Lower Body Dressing Details (indicate cue type and reason): to don mesh panties Toilet Transfer: Supervision/safety;Contact guard assist;Regular Toilet;Grab bars;Rolling walker (2 wheels)                  Extremity/Trunk Assessment              Vision       Perception     Praxis     Communication Communication Communication: No apparent difficulties Factors Affecting Communication: Hearing impaired   Cognition Arousal: Alert Behavior During Therapy: Anxious  Following commands: Intact        Cueing   Cueing Techniques: Verbal cues, Gestural cues, Visual cues, Tactile cues  Exercises Other Exercises Other Exercises: Edu on figure four position for dressing to minimze bend/discomfort to abdomen.    Shoulder Instructions       General Comments placed on RA for session with sp02 readings at 89-91%    Pertinent Vitals/  Pain       Pain Assessment Pain Assessment: Faces Faces Pain Scale: Hurts a little bit Pain Location: abdomen-stretch with standing Pain Descriptors / Indicators: Discomfort Pain Intervention(s): Monitored during session, Repositioned  Home Living                                          Prior Functioning/Environment              Frequency  Min 1X/week        Progress Toward Goals  OT Goals(current goals can now be found in the care plan section)  Progress towards OT goals: Progressing toward goals  Acute Rehab OT Goals Patient Stated Goal: improve strength OT Goal Formulation: With patient/family Time For Goal Achievement: 07/16/23 Potential to Achieve Goals: Good  Plan      Co-evaluation                 AM-PAC OT "6 Clicks" Daily Activity     Outcome Measure   Help from another person eating meals?: None Help from another person taking care of personal grooming?: None Help from another person toileting, which includes using toliet, bedpan, or urinal?: A Little Help from another person bathing (including washing, rinsing, drying)?: A Little Help from another person to put on and taking off regular upper body clothing?: None Help from another person to put on and taking off regular lower body clothing?: A Little 6 Click Score: 21    End of Session Equipment Utilized During Treatment: Oxygen;Rolling walker (2 wheels)  OT Visit Diagnosis: Other abnormalities of gait and mobility (R26.89);Muscle weakness (generalized) (M62.81)   Activity Tolerance Patient tolerated treatment well   Patient Left in chair;with call bell/phone within reach;with chair alarm set;with family/visitor present (handoff to PT)   Nurse Communication Mobility status        Time: 1323-1340 OT Time Calculation (min): 17 min  Charges: OT General Charges $OT Visit: 1 Visit OT Treatments $Self Care/Home Management : 8-22 mins  Jerzy Crotteau,  OTR/L  07/03/23, 3:07 PM   Amilah Greenspan E Tamaria Dunleavy 07/03/2023, 3:03 PM

## 2023-07-03 NOTE — Plan of Care (Signed)

## 2023-07-03 NOTE — Progress Notes (Signed)
 Patient ID: Tammy Ashley, female   DOB: [birth year], 78 y.o.   MRN: [id number]     SURGICAL PROGRESS NOTE   Hospital Day(s): 3.   Interval History: Patient seen and examined, no acute events or new complaints overnight. Patient reports feeling great this morning.  She endorses that she had a better afternoon and she had a good night.  She continue passing gas and having bowel movement.  No issues with liquid diet.  No significant pain.  Burnice Logan was removed today.  Incisions are dry and clean.  Vital signs in last 24 hours: [min-max] current  Temp:  [98.1 F (36.7 C)-98.6 F (37 C)] 98.3 F (36.8 C) (02/18 0412) Pulse Rate:  [92-118] 100 (02/18 0412) Resp:  [18-19] 18 (02/18 0412) BP: (101-119)/(53-67) 105/62 (02/18 0412) SpO2:  [92 %-100 %] 98 % (02/18 0412) Weight:  [71.1 kg] 71.1 kg (02/18 0416)     Height: 5' (152.4 cm) Weight: 71.1 kg BMI (Calculated): 30.61   Physical Exam:  Constitutional: alert, cooperative and no distress  Respiratory: breathing non-labored at rest  Cardiovascular: regular rate and sinus rhythm  Gastrointestinal: soft, non-tender, and non-distended.  Incisions are dry and clean  Labs:     Latest Ref Rng & Units 07/03/2023    5:05 AM 07/02/2023    5:44 AM 07/01/2023    3:38 AM  CBC  WBC 4.0 - 10.5 K/uL 7.9  8.8  8.2   Hemoglobin 12.0 - 15.0 g/dL 40.9  81.1  91.4   Hematocrit 36.0 - 46.0 % 37.2  39.4  41.3   Platelets 150 - 400 K/uL 258  267  302       Latest Ref Rng & Units 07/03/2023    5:05 AM 07/02/2023    5:44 AM 07/01/2023    3:38 AM  CMP  Glucose 70 - 99 mg/dL 782  956  213   BUN 8 - 23 mg/dL 15  14  21    Creatinine 0.44 - 1.00 mg/dL 0.86  5.78  4.69   Sodium 135 - 145 mmol/L 141  140  140   Potassium 3.5 - 5.1 mmol/L 3.3  3.6  4.0   Chloride 98 - 111 mmol/L 100  102  104   CO2 22 - 32 mmol/L 30  29  25    Calcium 8.9 - 10.3 mg/dL 8.4  8.4  8.7   Total Protein 6.5 - 8.1 g/dL   6.1   Total Bilirubin 0.0 - 1.2 mg/dL   0.9   Alkaline  Phos 38 - 126 U/L   39   AST 15 - 41 U/L   18   ALT 0 - 44 U/L   16     Imaging studies: No new pertinent imaging studies   Assessment/Plan:  78 y.o. female with strangulating incisional hernia 3 Day Post-Op s/p small bowel resection and repair of incisional hernia, complicated by pertinent comorbidities including uncontrolled CHF, coronary artery disease, diabetes.   -Patient currently stable without clinical toleration from cardiac standpoint -Very high risk for cardiac deterioration due to recent diagnosis of uncontrolled CHF -BNP over 700 on admission -Continue adequate progress.  Patient feeling great.  She tolerated liquid diet.  Will advance diet to soft diet. -The incision is clean.  Praveena removed.  No sign of complications. -From surgical standpoint if patient tolerates soft diet, she can be discharged to home with home health or skin nursing facility as per family preference. -Continue medical management as per primary team -I  will continue to follow closely while patient is in-house  Gae Gallop, MD

## 2023-07-03 NOTE — [place] FL2 (Signed)
 [place] MEDICAID FL2 LEVEL OF CARE FORM     IDENTIFICATION  Patient Name: Tammy Ashley Birthdate: 12-Jan-1946 Sex: female Admission Date (Current Location): 06/30/2023  [facility] and [place] Number:  Chiropodist and Address:  [facility], [address], [place], [place] [address]      Provider Number: [id number]  Attending Physician Name and Address:  Delfino Lovett, MD  Relative Name and Phone Number:       Current Level of Care: Hospital Recommended Level of Care: Skilled Nursing Facility Prior Approval Number:    Date Approved/Denied:   PASRR Number: [id number]  Discharge Plan: SNF    Current Diagnoses: Patient Active Problem List   Diagnosis Date Noted   Chronic HFrEF (heart failure with reduced ejection fraction) (HCC) 06/30/2023   Coronary artery disease 06/30/2023   Type 2 diabetes mellitus (HCC) 06/30/2023   Essential hypertension 06/30/2023   Pheochromocytoma 06/30/2023   SBO (small bowel obstruction) (HCC) 06/30/2023   Psoriasis 10/19/2016   S/P total knee replacement using cement 09/25/2013   Arthritis, rheumatoid (HCC) 10/15/2012   Uterine cancer (HCC) 10/15/2012    Orientation RESPIRATION BLADDER Height & Weight     Self, Time, Situation, Place  O2 (Nasal Cannula 3 L) Incontinent Weight: 156 lb 12 oz (71.1 kg) Height:  5' (152.4 cm)  BEHAVIORAL SYMPTOMS/MOOD NEUROLOGICAL BOWEL NUTRITION STATUS   (None)  (None) Continent Diet (Soft diet)  AMBULATORY STATUS COMMUNICATION OF NEEDS Skin   Limited Assist Verbally Surgical wounds (Incision on abdomen: Staples open to air.)                       Personal Care Assistance Level of Assistance  Bathing, Feeding, Dressing Bathing Assistance: Maximum assistance Feeding assistance: Limited assistance Dressing Assistance: Maximum assistance     Functional Limitations Info  Sight, Hearing, Speech Sight Info: Adequate Hearing Info: Adequate Speech Info:  Adequate    SPECIAL CARE FACTORS FREQUENCY  PT (By licensed PT), OT (By licensed OT)     PT Frequency: 5 x week OT Frequency: 5 x week            Contractures Contractures Info: Not present    Additional Factors Info  Code Status, Allergies Code Status Info: Full code Allergies Info: Hydrocodone-acetaminophen, Oxycodone-acetaminophen, Aspirin           Current Medications (07/03/2023):  This is the current hospital active medication list Current Facility-Administered Medications  Medication Dose Route Frequency Provider Last Rate Last Admin   acetaminophen (TYLENOL) 160 MG/5ML solution 650 mg  650 mg Oral Q6H PRN Orson Aloe, RPH   650 mg at 07/03/23 0865   clopidogrel (PLAVIX) tablet 75 mg  75 mg Oral Daily Barrie Folk, RPH   75 mg at 07/03/23 7846   dextromethorphan-guaiFENesin (MUCINEX DM) 30-600 MG per 12 hr tablet 1 tablet  1 tablet Oral BID Delfino Lovett, MD   1 tablet at 07/03/23 9629   insulin aspart (novoLOG) injection 0-5 Units  0-5 Units Subcutaneous QHS Delfino Lovett, MD       insulin aspart (novoLOG) injection 0-9 Units  0-9 Units Subcutaneous TID WC Delfino Lovett, MD   2 Units at 07/03/23 1134   ipratropium-albuterol (DUONEB) 0.5-2.5 (3) MG/3ML nebulizer solution 3 mL  3 mL Nebulization Q6H PRN Delfino Lovett, MD   3 mL at 07/02/23 1934   LORazepam (ATIVAN) injection 0.5 mg  0.5 mg Intravenous Q6H PRN Delfino Lovett, MD  metoprolol tartrate (LOPRESSOR) tablet 25 mg  25 mg Oral BID Delfino Lovett, MD   25 mg at 07/03/23 0836   ondansetron (ZOFRAN) tablet 4 mg  4 mg Oral Q6H PRN Carolan Shiver, MD       Or   ondansetron (ZOFRAN) injection 4 mg  4 mg Intravenous Q6H PRN Carolan Shiver, MD       sodium chloride flush (NS) 0.9 % injection 3 mL  3 mL Intravenous Q12H Carolan Shiver, MD   3 mL at 07/03/23 1610     Discharge Medications: Please see discharge summary for a list of discharge medications.  Relevant Imaging  Results:  Relevant Lab Results:   Additional Information SS#: [id number]  Margarito Liner, LCSW

## 2023-07-03 NOTE — Progress Notes (Signed)
 1      PROGRESS NOTE    Tammy Ashley  WUJ:[id number] DOB: 04/17/1946 DOA: 06/30/2023 PCP: Adrian Prince, MD    Brief Narrative:  78 y.o. female with medical history significant of HFpEF with last EF of 25-30%, obstructive CAD, pheochromocytoma s/p right adrenalectomy, rheumatoid arthritis, type 2 diabetes, mild pulmonary hypertension, endometrial cancer (2010), hyperlipidemia, who presents to the ED due to abdominal pain   2/15: Surgery consult for strangulated incisional hernia  2/16: Robotic assisted laparoscopic strangulated incisional hernia repair with mesh Small bowel resection with anastomosis 2/17: diet advanced to full liquid, CXR, Lasix, mucinex, duoneb 2/18: PT, OT recommend SNF.  TOC working on placement  Assessment & Plan:   Principal Problem:   SBO (small bowel obstruction) (HCC) Active Problems:   Chronic HFrEF (heart failure with reduced ejection fraction) (HCC)   Essential hypertension   Arthritis, rheumatoid (HCC)   Coronary artery disease   Type 2 diabetes mellitus (HCC)  * SBO (small bowel obstruction) (HCC) Strangulated incisional hernia Patient is presenting with several day history of intractable nausea/vomiting with mild abdominal pain with findings concerning for SBO versus inflammation.  Given multiple previous abdominal surgeries and complicated ventral hernia, she is admitted for strangulated incisional hernia - General Surgery following - Status post robotic assisted laparoscopic incisional hernia repair yesterday evening on 2/15.  POD 2 - Started soft diet - Further management per surgical team   Chronic HFrEF (heart failure with reduced ejection fraction) (HCC) Recently diagnosed HFrEF with last EF of 25-30%, currently established with heart failure clinic followed by Dr. Shirlee Latch.  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  Patient appears hypovolemic, in the setting of vomiting and SBO. - change lopressor to PO metoprolol now that she  is taking PO - Hold home GDMT and diuretics - Daily weights - Lasix 40 mg IV once, added Mucinex, duoneb for cough. Cxr shows small pleural effusion   Essential hypertension Previous history of hypertension, with chronic hypotension after being started on GDMT for HFrEF.  Per cardiology note, SBP generally between 90-100 now.  Blood pressure currently within that range.   - Hold home regimen given n.p.o. with NG tube -Metoprolol   Type 2 diabetes mellitus (HCC) History of type 2 diabetes with no recent A1c available in chart. Currently managed with Farxiga and metformin.   - SSI, sensitive - A1c 7 - Hold home regimen   Coronary artery disease Recent left heart cath with chronically occluded LAD, not amenable to PCI or CABG.   - Continue Plavix - Hold home atorvastatin pending bowel function return   Arthritis, rheumatoid (HCC) Currently managed with Orencia and leflunomide.    - Hold home regimen at this time   Anxiety Ativan as needed    DVT prophylaxis: SCDs SCDs Start: 06/30/23 1735     Code Status: Full code Family Communication: Son updated at bedside Disposition Plan: Possible discharge in next 1 to 2 days depending on clinical condition..  Waiting for SNF placement   Consultants:  Surgery  Procedures:  Robotic assisted laparoscopic strangulated incisional hernia repair with mesh Small bowel resection with anastomosis    Subjective:  Feeling better.  Very weak.  Son at bedside  Objective: Vitals:   07/03/23 0412 07/03/23 0416 07/03/23 0827 07/03/23 1609  BP: 105/62  (!) 122/45 124/68  Pulse: 100  (!) 106 (!) 105  Resp: 18  19 18   Temp: 98.3 F (36.8 C)  (!) 97.4 F (36.3 C) 98 F (36.7 C)  TempSrc: Oral  Oral   SpO2: 98%  97% 98%  Weight:  71.1 kg    Height:        Intake/Output Summary (Last 24 hours) at 07/03/2023 1641 Last data filed at 07/03/2023 1222 Gross per 24 hour  Intake --  Output 750 ml  Net -750 ml   Filed Weights    06/30/23 1314 07/03/23 0416  Weight: 69.4 kg 71.1 kg    Examination:  General exam: Appears calm and comfortable  Respiratory system: rales at bases Cardiovascular system: S1 & S2 heard, RRR. No JVD, murmurs, rubs, gallops or clicks. No pedal edema. Gastrointestinal system: Abdomen is nondistended, soft and surgical site/staples/dressing in place Central nervous system: Alert and oriented. No focal neurological deficits. Extremities: Symmetric 5 x 5 power. Skin: No rashes, lesions or ulcers Psychiatry: Judgement and insight appear normal. Mood & affect appropriate.     Data Reviewed: I have personally reviewed following labs and imaging studies  CBC: Recent Labs  Lab 06/30/23 1316 07/01/23 0338 07/02/23 0544 07/03/23 0505  WBC 8.2 8.2 8.8 7.9  HGB 16.0* 13.2 12.5 11.9*  HCT 49.3* 41.3 39.4 37.2  MCV 87.9 88.8 89.3 89.4  PLT 337 302 267 258   Basic Metabolic Panel: Recent Labs  Lab 06/30/23 1316 07/01/23 0338 07/02/23 0544 07/03/23 0505  NA 137 140 140 141  K 4.6 4.0 3.6 3.3*  CL 100 104 102 100  CO2 23 25 29 30   GLUCOSE 169* 190* 177* 140*  BUN 21 21 14 15   CREATININE 0.59 0.57 0.47 0.57  CALCIUM 9.2 8.7* 8.4* 8.4*  MG  --  1.8  --   --   PHOS  --  5.1*  --   --    GFR: Estimated Creatinine Clearance: 51.8 mL/min (by C-G formula based on SCr of 0.57 mg/dL). Liver Function Tests: Recent Labs  Lab 06/30/23 1316 07/01/23 0338  AST 23 18  ALT 10 16  ALKPHOS 41 39  BILITOT 1.7* 0.9  PROT 6.8 6.1*  ALBUMIN 3.7 3.4*   Recent Labs  Lab 06/30/23 1316  LIPASE 19    HbA1C: Recent Labs    07/01/23 0338  HGBA1C 7.0*   CBG: Recent Labs  Lab 07/02/23 1628 07/02/23 2110 07/03/23 0756 07/03/23 1115 07/03/23 1608  GLUCAP 131* 132* 130* 197* 140*     No results found for this or any previous visit (from the past 240 hours).       Radiology Studies: DG Chest Port 1 View Result Date: 07/02/2023 CLINICAL DATA:  Shortness of breath. EXAM:  PORTABLE CHEST 1 VIEW COMPARISON:  08/27/2012. FINDINGS: Patient is rotated. Trachea is midline. Heart is enlarged, stable. Thoracic aorta is calcified. Lungs are low in volume with streaky scarring in the left perihilar region and right lung base. Small left pleural effusion. IMPRESSION: Small left pleural effusion. Electronically Signed   By: Leanna Battles M.D.   On: 07/02/2023 16:07   DG Abd Portable 1V-Small Bowel Obstruction Protocol-initial, 8 hr delay Result Date: 07/01/2023 CLINICAL DATA:  Small-bowel obstruction 8 hour delay. EXAM: PORTABLE ABDOMEN - 1 VIEW COMPARISON:  X-ray abdomen 07/01/2023, CT abdomen pelvis 06/30/2023 FINDINGS: Enteric tube courses below the hemidiaphragm with tip and side port overlying the expected region of the gastric lumen. The enteric tube is coiled once within the gastric lumen. PO contrast reaches the rectum. The bowel gas pattern is normal. No radio-opaque calculi or other significant radiographic abnormality are seen. Severe degenerative changes of the right hip. IMPRESSION:  1. Enteric tube in good position; however, could be retracted by 15 cm. 2. PO contrast reaches the rectum. 3. Nonobstructive bowel gas pattern. 4. Severe right hip degenerative changes. Electronically Signed   By: Tish Frederickson M.D.   On: 07/01/2023 20:21        Scheduled Meds:  clopidogrel  75 mg Oral Daily   dextromethorphan-guaiFENesin  1 tablet Oral BID   insulin aspart  0-5 Units Subcutaneous QHS   insulin aspart  0-9 Units Subcutaneous TID WC   metoprolol tartrate  25 mg Oral BID   sodium chloride flush  3 mL Intravenous Q12H   Continuous Infusions:   LOS: 3 days    Time spent: 35 minutes    Tammy Lovett, MD [facility] Pager [phone]  If 7PM-7AM, please contact night-coverage  07/03/2023, 4:41 PM

## 2023-07-03 NOTE — Consult Note (Signed)
 PHARMACY CONSULT NOTE - FOLLOW UP  Pharmacy Consult for Electrolyte Monitoring and Replacement   Recent Labs: Potassium (mmol/L)  Date Value  07/03/2023 3.3 (L)  04/11/2013 3.5   Magnesium (mg/dL)  Date Value  40/98/1191 1.8   Calcium (mg/dL)  Date Value  47/82/9562 8.4 (L)   Calcium, Total (mg/dL)  Date Value  13/12/6576 8.0 (L)   Albumin (g/dL)  Date Value  46/96/2952 3.4 (L)  09/09/2012 2.7 (L)   Phosphorus (mg/dL)  Date Value  84/13/2440 5.1 (H)   Sodium (mmol/L)  Date Value  07/03/2023 141  06/20/2023 142  04/11/2013 138     Assessment: 78 y.o. female with medical history significant of HFpEF with last EF of 25-30%, obstructive CAD, pheochromocytoma s/p right adrenalectomy, rheumatoid arthritis, type 2 diabetes, mild pulmonary hypertension, endometrial cancer (2010), hyperlipidemia, who presents to the ED due to abdominal pain   Lasix IV x 1 2/18.   Goal of Therapy:  WNL  Plan:  Medical team ordered Kcl 40 mEq x 1.  Mg 2 g IV x 1 F/u with AM labs.   Ronnald Ramp ,PharmD Clinical Pharmacist 07/03/2023 5:46 PM

## 2023-07-03 NOTE — Plan of Care (Signed)
  Problem: Education: Goal: Ability to describe self-care measures that may prevent or decrease complications (Diabetes Survival Skills Education) will improve 07/03/2023 0739 by Judene Companion, RN Outcome: Progressing 07/03/2023 0738 by Judene Companion, RN Outcome: Progressing Goal: Individualized Educational Video(s) 07/03/2023 0739 by Judene Companion, RN Outcome: Progressing 07/03/2023 0738 by Judene Companion, RN Outcome: Progressing   Problem: Coping: Goal: Ability to adjust to condition or change in health will improve 07/03/2023 0739 by Judene Companion, RN Outcome: Progressing 07/03/2023 0738 by Judene Companion, RN Outcome: Progressing   Problem: Fluid Volume: Goal: Ability to maintain a balanced intake and output will improve 07/03/2023 0739 by Judene Companion, RN Outcome: Progressing 07/03/2023 0738 by Judene Companion, RN Outcome: Progressing

## 2023-07-03 NOTE — TOC Progression Note (Signed)
 Transition of Care [facility]) - Progression Note    Patient Details  Name: Tammy Ashley MRN: [id number] Date of Birth: [birth year]  [facility]) CM/SW Contact  Margarito Liner, LCSW Phone Number: 07/03/2023, 1:24 PM  Clinical Narrative:   Per MD, patient and son are agreeable to SNF placement. CSW met with them at bedside and they confirmed. First preference is [facility] and [address]. CSW left the admissions coordinator a message to notify and ask her to review the referral.   Expected Discharge Plan: Skilled Nursing Facility Barriers to Discharge: Continued Medical Work up  Expected Discharge Plan and Services       Living arrangements for the past 2 months: Single Family Home                                       Social Determinants of Health (SDOH) Interventions SDOH Screenings   Food Insecurity: No Food Insecurity (07/01/2023)  Housing: Low Risk  (07/01/2023)  Transportation Needs: No Transportation Needs (07/01/2023)  Utilities: Not At Risk (07/01/2023)  Financial Resource Strain: Medium Risk (05/25/2023)  Social Connections: Unknown (07/01/2023)  Tobacco Use: Medium Risk (06/30/2023)    Readmission Risk Interventions    07/01/2023    5:06 PM  Readmission Risk Prevention Plan  Post Dischage Appt Complete  Medication Screening Complete  Transportation Screening Complete

## 2023-07-03 NOTE — Care Management Important Message (Signed)
 Important Message  Patient Details  Name: Tammy Ashley MRN: [id number] Date of Birth: 1945-07-02   Important Message Given:  Yes - Medicare IM     Cristela Blue, CMA 07/03/2023, 10:32 AM

## 2023-07-03 NOTE — Progress Notes (Signed)
 Physical Therapy Treatment Patient Details Name: MARICSA SAMMONS MRN: [id number] DOB: 02-Dec-1945 Today's Date: 07/03/2023   History of Present Illness Pt is a 78 y/o F admitted on 06/30/23 after presenting with c/o abdominal pain. Abdominal CT concerning for SBO vs enteritis with complex ventral abdominal wall hernia. Pt is s/p small bowel resection & repair of incisional hernia. PMH: HFpEF, Obstructive CAD, pheochromocytoma s/p R adrenalectomy, RA, DM2, mild pulmonary HTN, endometrial CA (2010), HLD    PT Comments  Pt is making great progress towards goals. No coughing spells this date. Pt able to ambulate in room using RW with occasional cues for RW management. All mobility performed on RA with sats WNL, pt requesting to have O2 on once in recliner and set up with lunch tray. Updated pt/family/TOC on disposition progress. Will continue to progress as able.     If plan is discharge home, recommend the following: A little help with walking and/or transfers;Assist for transportation;Help with stairs or ramp for entrance   Can travel by private vehicle     Yes  Equipment Recommendations  Rolling walker (2 wheels)    Recommendations for Other Services       Precautions / Restrictions Precautions Precautions: Fall Recall of Precautions/Restrictions: Intact Restrictions Weight Bearing Restrictions Per Provider Order: No     Mobility  Bed Mobility               General bed mobility comments: recevied seated at EOB with OT upon arrival    Transfers Overall transfer level: Needs assistance Equipment used: Rolling walker (2 wheels) Transfers: Sit to/from Stand Sit to Stand: Supervision           General transfer comment: occasional cue for pushing from seated surface. Once standing, RW used    Ambulation/Gait Ambulation/Gait assistance: Supervision Gait Distance (Feet): 50 Feet Assistive device: Rolling walker (2 wheels) Gait Pattern/deviations: Step-through  pattern       General Gait Details: ambulated around room with seated rest break as needed. O2 monitored on RA with sats at 90%. No dizziness noted   Stairs             Wheelchair Mobility     Tilt Bed    Modified Rankin (Stroke Patients Only)       Balance Overall balance assessment: Needs assistance (Simultaneous filing. User may not have seen previous data.) Sitting-balance support: Feet supported (Simultaneous filing. User may not have seen previous data.) Sitting balance-Leahy Scale: Good (Simultaneous filing. User may not have seen previous data.)     Standing balance support: Bilateral upper extremity supported, During functional activity (Simultaneous filing. User may not have seen previous data.) Standing balance-Leahy Scale: Good (Simultaneous filing. User may not have seen previous data.)                              Communication Communication Communication: No apparent difficulties Factors Affecting Communication: Hearing impaired  Cognition Arousal: Alert Behavior During Therapy: Anxious   PT - Cognitive impairments: No apparent impairments                       PT - Cognition Comments: improved and agreeable to session Following commands: Intact      Cueing Cueing Techniques: Verbal cues, Gestural cues, Visual cues, Tactile cues  Exercises Other Exercises Other Exercises: ambulated to bathroom to perform toilet transfer and ambulated to [facility] with ability to perform hygiene safely. Then  able to ambulate to sink and wash hands    General Comments General comments (skin integrity, edema, etc.): placed on RA for session with sp02 readings at 89-91%      Pertinent Vitals/Pain Pain Assessment Pain Assessment: No/denies pain Pain Intervention(s): Limited activity within patient's tolerance, Repositioned    Home Living                          Prior Function            PT Goals (current goals can now be found  in the care plan section) Acute Rehab PT Goals Patient Stated Goal: get better PT Goal Formulation: With patient Time For Goal Achievement: 07/15/23 Potential to Achieve Goals: Good Progress towards PT goals: Progressing toward goals    Frequency    Min 1X/week      PT Plan      Co-evaluation PT/OT/SLP Co-Evaluation/Treatment: Yes Reason for Co-Treatment: To address functional/ADL transfers PT goals addressed during session: Mobility/safety with mobility        AM-PAC PT "6 Clicks" Mobility   Outcome Measure  Help needed turning from your back to your side while in a flat bed without using bedrails?: None Help needed moving from lying on your back to sitting on the side of a flat bed without using bedrails?: None Help needed moving to and from a bed to a chair (including a wheelchair)?: A Little Help needed standing up from a chair using your arms (e.g., wheelchair or bedside chair)?: A Little Help needed to walk in hospital room?: A Little Help needed climbing 3-5 steps with a railing? : A Little 6 Click Score: 20    End of Session Equipment Utilized During Treatment: Oxygen Activity Tolerance: Patient limited by fatigue Patient left: in chair;with chair alarm set Nurse Communication: Mobility status PT Visit Diagnosis: Pain;Unsteadiness on feet (R26.81);Muscle weakness (generalized) (M62.81);Difficulty in walking, not elsewhere classified (R26.2)     Time: 8295-6213 PT Time Calculation (min) (ACUTE ONLY): 28 min  Charges:    $Gait Training: 8-22 mins PT General Charges $$ ACUTE PT VISIT: 1 Visit                     Elizabeth Palau, PT, DPT, GCS [phone]    Ej Pinson 07/03/2023, 3:05 PM

## 2023-07-04 DIAGNOSIS — K56609 Unspecified intestinal obstruction, unspecified as to partial versus complete obstruction: Secondary | ICD-10-CM | POA: Diagnosis not present

## 2023-07-04 LAB — CBC
HCT: 38.7 % (ref 36.0–46.0)
Hemoglobin: 12.4 g/dL (ref 12.0–15.0)
MCH: 28.5 pg (ref 26.0–34.0)
MCHC: 32 g/dL (ref 30.0–36.0)
MCV: 89 fL (ref 80.0–100.0)
Platelets: 294 10*3/uL (ref 150–400)
RBC: 4.35 MIL/uL (ref 3.87–5.11)
RDW: 14.7 % (ref 11.5–15.5)
WBC: 7.3 10*3/uL (ref 4.0–10.5)
nRBC: 0 % (ref 0.0–0.2)

## 2023-07-04 LAB — RENAL FUNCTION PANEL
Albumin: 2.8 g/dL — ABNORMAL LOW (ref 3.5–5.0)
Anion gap: 11 (ref 5–15)
BUN: 15 mg/dL (ref 8–23)
CO2: 30 mmol/L (ref 22–32)
Calcium: 8.6 mg/dL — ABNORMAL LOW (ref 8.9–10.3)
Chloride: 100 mmol/L (ref 98–111)
Creatinine, Ser: 0.46 mg/dL (ref 0.44–1.00)
GFR, Estimated: >60 mL/min (ref >60)
Glucose, Bld: 156 mg/dL — ABNORMAL HIGH (ref 70–99)
Phosphorus: 2 mg/dL — ABNORMAL LOW (ref 2.5–4.6)
Potassium: 3.5 mmol/L (ref 3.5–5.1)
Sodium: 141 mmol/L (ref 135–145)

## 2023-07-04 LAB — GLUCOSE, CAPILLARY
Glucose-Capillary: 135 mg/dL — ABNORMAL HIGH (ref 70–99)
Glucose-Capillary: 178 mg/dL — ABNORMAL HIGH (ref 70–99)

## 2023-07-04 LAB — MAGNESIUM: Magnesium: 2 mg/dL (ref 1.7–2.4)

## 2023-07-04 MED ORDER — ORAL CARE MOUTH RINSE: 15.0000 mL | OROMUCOSAL | Status: DC | PRN

## 2023-07-04 MED ORDER — DIPHENHYDRAMINE-APAP (SLEEP) 25-500 MG PO TABS: 1.0000 | ORAL_TABLET | Freq: Every evening | ORAL | Status: AC | PRN

## 2023-07-04 MED ORDER — IPRATROPIUM-ALBUTEROL 0.5-2.5 (3) MG/3ML IN SOLN: 3.0000 mL | Freq: Four times a day (QID) | RESPIRATORY_TRACT | Status: DC | PRN

## 2023-07-04 MED ORDER — METOPROLOL TARTRATE 25 MG PO TABS: 25.0000 mg | ORAL_TABLET | Freq: Two times a day (BID) | ORAL | Status: DC

## 2023-07-04 MED ORDER — DM-GUAIFENESIN ER 30-600 MG PO TB12: 1.0000 | ORAL_TABLET | Freq: Two times a day (BID) | ORAL | Status: DC

## 2023-07-04 MED ORDER — ONDANSETRON HCL 4 MG PO TABS: 4.0000 mg | ORAL_TABLET | Freq: Four times a day (QID) | ORAL | Status: AC | PRN

## 2023-07-04 MED ORDER — MELATONIN 5 MG PO TABS: 5.0000 mg | ORAL_TABLET | Freq: Every day | ORAL | Status: AC

## 2023-07-04 MED ORDER — K PHOS MONO-SOD PHOS DI & MONO 155-852-130 MG PO TABS
500.0000 mg | ORAL_TABLET | Freq: Once | ORAL | Status: AC
  Administered 2023-07-04: 500 mg via ORAL
  Filled 2023-07-04: qty 2

## 2023-07-04 NOTE — Plan of Care (Signed)
  Problem: Education: Goal: Ability to describe self-care measures that may prevent or decrease complications (Diabetes Survival Skills Education) will improve Outcome: Progressing   Problem: Nutritional: Goal: Maintenance of adequate nutrition will improve Outcome: Progressing   Problem: Skin Integrity: Goal: Risk for impaired skin integrity will decrease Outcome: Progressing   Problem: Clinical Measurements: Goal: Ability to maintain clinical measurements within normal limits will improve Outcome: Progressing   Problem: Pain Managment: Goal: General experience of comfort will improve and/or be controlled Outcome: Progressing

## 2023-07-04 NOTE — TOC Transition Note (Signed)
 Transition of Care [facility]) - Discharge Note   Patient Details  Name: Tammy Ashley MRN: [id number] Date of Birth: 1945-08-08  [facility]) CM/SW Contact:  Margarito Liner, LCSW Phone Number: 07/04/2023, 1:55 PM   Clinical Narrative:   Patient has orders to discharge to [facility] today. RN will call report to [phone] (Room 908). EMS transport has been arranged since she requires acute oxygen and she is 3rd on the list. No further concerns. CSW signing off.  Final next level of care: Skilled Nursing Facility Barriers to Discharge: Barriers Resolved   Patient Goals and CMS Choice   CMS Medicare.gov Compare Post Acute Care list provided to:: Patient Represenative (must comment) Choice offered to / list presented to : Adult Children [place] ownership interest in [facility].provided to:: Adult Children    Discharge Placement   Existing PASRR number confirmed : 07/03/23          Patient [facility] Patient to be transferred to facility by: EMS Name of family member notified: Verlee Monte Patient and family notified of of transfer: 07/04/23  Discharge Plan and Services Additional resources added to the After Visit Summary for                                       Social Drivers of Health (SDOH) Interventions SDOH Screenings   Food Insecurity: No Food Insecurity (07/01/2023)  Housing: Low Risk  (07/01/2023)  Transportation Needs: No Transportation Needs (07/01/2023)  Utilities: Not At Risk (07/01/2023)  Financial Resource Strain: Medium Risk (05/25/2023)  Social Connections: Unknown (07/01/2023)  Tobacco Use: Medium Risk (06/30/2023)     Readmission Risk Interventions    07/01/2023    5:06 PM  Readmission Risk Prevention Plan  Post Dischage Appt Complete  Medication Screening Complete  Transportation Screening Complete

## 2023-07-04 NOTE — Plan of Care (Signed)

## 2023-07-04 NOTE — Discharge Summary (Signed)
 Physician Discharge Summary   Patient: Tammy Ashley MRN: [id number] DOB: 1945/11/15  Admit date:     06/30/2023  Discharge date: 07/04/23  Discharge Physician: Pennie Banter   PCP: Adrian Prince, MD   Recommendations at discharge:   Follow up with General Surgery Follow up with Primary Care Follow up with Cardiology Resume Entresto and spironolactone in follow up as BP tolerates Monitor blood sugars and adjust regimen as needed for glycemic control Follow up on efficacy of melatonin for sleep, started new  Discharge Diagnoses: Principal Problem:   SBO (small bowel obstruction) (HCC) Active Problems:   Chronic HFrEF (heart failure with reduced ejection fraction) (HCC)   Essential hypertension   Arthritis, rheumatoid (HCC)   Coronary artery disease   Type 2 diabetes mellitus (HCC)  Resolved Problems:   * No resolved hospital problems. *  Hospital Course:  "78 y.o. female with medical history significant of HFpEF with last EF of 25-30%, obstructive CAD, pheochromocytoma s/p right adrenalectomy, rheumatoid arthritis, type 2 diabetes, mild pulmonary hypertension, endometrial cancer (2010), hyperlipidemia, who presents to the ED due to abdominal pain    2/15: Surgery consult for strangulated incisional hernia  2/16: Robotic assisted laparoscopic strangulated incisional hernia repair with mesh Small bowel resection with anastomosis 2/17: diet advanced to full liquid, CXR, Lasix, mucinex, duoneb 2/18: PT, OT recommend SNF.  TOC working on placement"    2/19 -- pt doing well and medically stable for discharge to SNF today. Pt has no acute complaints other than difficulty sleeping which is also a chronic problem at home. Surgery has signed off on discharge. Patient and family updated and in agreement.   Assessment and Plan:  * SBO (small bowel obstruction) (HCC) Strangulated incisional hernia Patient is presenting with several day history of intractable  nausea/vomiting with mild abdominal pain with findings concerning for SBO versus inflammation.  Given multiple previous abdominal surgeries and complicated ventral hernia, she is admitted for strangulated incisional hernia - General Surgery following - Status post robotic assisted laparoscopic incisional hernia repair yesterday evening on 2/15.  POD 2 - Started soft diet -- tolerating well - Follow up with General Surgery outpatient   Chronic HFrEF (heart failure with reduced ejection fraction) (HCC) Recently diagnosed HFrEF with last EF of 25-30%, currently established with heart failure clinic followed by Dr. Shirlee Latch.  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  Patient appears hypovolemic, in the setting of vomiting and SBO. - change lopressor to PO metoprolol now that she is taking PO - Hold home GDMT and diuretics - Daily weights - Lasix 40 mg IV once, added Mucinex, duoneb for cough. Cxr shows small pleural effusion   Essential hypertension Previous history of hypertension, with chronic hypotension after being started on GDMT for HFrEF.  Per cardiology note, SBP generally between 90-100 now.  Blood pressure currently within that range. -Metoprolol --HOLDING: Entresto and spironolactone   Type 2 diabetes mellitus (HCC) History of type 2 diabetes with no recent A1c available in chart. Currently managed with Farxiga and metformin. - covered with sliding scale Novolog during admission. --Resume home Farxiga at [facility] SSI, sensitive - A1c 7 - poorly controlled --PCP follow up   Coronary artery disease Recent left heart cath with chronically occluded LAD, not amenable to PCI or CABG. - Continue Plavix, Lipitor   Arthritis, rheumatoid (HCC) Currently managed with Orencia and leflunomide. \--Resume on discharge   Anxiety Ativan as needed was used during admission --PCP follow up  Consultants: General surgery Procedures performed: Robotic assisted laparoscopic  strangulated incisional hernia repair with mesh Small bowel resection with anastomosis    Disposition: Skilled nursing facility  Diet recommendation:  Discharge Diet Orders (From admission, onward)     Start     Ordered   07/04/23 0000  Diet - soft / low fiber     07/04/23 1237            DISCHARGE MEDICATION: Allergies as of 07/04/2023       Reactions   Hydrocodone-acetaminophen Hives   Oxycodone-acetaminophen Hives   Aspirin Rash   Other reaction(s): Unknown        Medication List     STOP taking these medications    Entresto 24-26 MG Generic drug: sacubitril-valsartan   furosemide 40 MG tablet Commonly known as: LASIX   metoprolol succinate 25 MG 24 hr tablet Commonly known as: Toprol XL   spironolactone 25 MG tablet Commonly known as: ALDACTONE       TAKE these medications    acetaminophen 650 MG CR tablet Commonly known as: TYLENOL Take 1,300 mg by mouth every morning.   atorvastatin 80 MG tablet Commonly known as: LIPITOR Take 1 tablet (80 mg total) by mouth daily.   clopidogrel 75 MG tablet Commonly known as: Plavix Take 1 tablet (75 mg total) by mouth daily.   Contour Next Test test strip Generic drug: glucose blood USE TO SELF MONITOR BLOOD GLUCOSE DAILY DX E11.9   dextromethorphan-guaiFENesin 30-600 MG 12hr tablet Commonly known as: MUCINEX DM Take 1 tablet by mouth 2 (two) times daily.   diphenhydramine-acetaminophen 25-500 MG Tabs tablet Commonly known as: TYLENOL PM Take 1-2 tablets by mouth at bedtime as needed. What changed:  when to take this reasons to take this   ergocalciferol 1.25 MG (50000 UT) capsule Commonly known as: VITAMIN D2 Take 50,000 Units by mouth once a week.   Farxiga 10 MG Tabs tablet Generic drug: dapagliflozin propanediol Take 1 tablet (10 mg total) by mouth daily before breakfast.   ipratropium-albuterol 0.5-2.5 (3) MG/3ML Soln Commonly known as: DUONEB Take 3 mLs by nebulization every 6 (six)  hours as needed.   leflunomide 10 MG tablet Commonly known as: ARAVA Take 10 mg by mouth daily.   melatonin 5 MG Tabs Take 1 tablet (5 mg total) by mouth at bedtime.   metFORMIN 500 MG 24 hr tablet Commonly known as: GLUCOPHAGE-XR Take 500 mg by mouth 2 (two) times daily.   metoprolol tartrate 25 MG tablet Commonly known as: LOPRESSOR Take 1 tablet (25 mg total) by mouth 2 (two) times daily.   multivitamin with minerals tablet Take 1 tablet by mouth daily.   ondansetron 4 MG tablet Commonly known as: ZOFRAN Take 1 tablet (4 mg total) by mouth every 6 (six) hours as needed for nausea.   Orencia 125 MG/ML Sosy Generic drug: Abatacept Inject 125 mg into the skin every 30 (thirty) days.   Restasis 0.05 % ophthalmic emulsion Generic drug: cycloSPORINE Place 1 drop into both eyes 2 (two) times daily.        Contact information for after-discharge care     Destination     [facility] Preferred SNF .   Service: Skilled Nursing Contact information: [address] [place] [address] [phone]                    Discharge Exam: Filed Weights   06/30/23 1314 07/03/23 0416 07/04/23 [phone]  Weight: 69.4 kg 71.1 kg 69.3 kg   General exam: awake, alert, no acute distress HEENT: atraumatic, clear conjunctiva, anicteric sclera, moist mucus membranes, hearing grossly normal  Respiratory system: CTAB, no wheezes, rales or rhonchi, normal respiratory effort. Cardiovascular system: normal S1/S2, RRR, no pedal edema.   Gastrointestinal system: soft, NT, ND, no HSM felt, +bowel sounds. Central nervous system: A&O x 3. no gross focal neurologic deficits, normal speech Extremities: moves all , no edema, normal tone Skin: dry, intact, normal temperature Psychiatry: normal mood, congruent affect, judgement and insight appear normal   Condition at discharge: stable  The results of significant diagnostics from this  hospitalization (including imaging, microbiology, ancillary and laboratory) are listed below for reference.   Imaging Studies: DG Chest Port 1 View Result Date: 07/02/2023 CLINICAL DATA:  Shortness of breath. EXAM: PORTABLE CHEST 1 VIEW COMPARISON:  08/27/2012. FINDINGS: Patient is rotated. Trachea is midline. Heart is enlarged, stable. Thoracic aorta is calcified. Lungs are low in volume with streaky scarring in the left perihilar region and right lung base. Small left pleural effusion. IMPRESSION: Small left pleural effusion. Electronically Signed   By: Leanna Battles M.D.   On: 07/02/2023 16:07   DG Abd Portable 1V-Small Bowel Obstruction Protocol-initial, 8 hr delay Result Date: 07/01/2023 CLINICAL DATA:  Small-bowel obstruction 8 hour delay. EXAM: PORTABLE ABDOMEN - 1 VIEW COMPARISON:  X-ray abdomen 07/01/2023, CT abdomen pelvis 06/30/2023 FINDINGS: Enteric tube courses below the hemidiaphragm with tip and side port overlying the expected region of the gastric lumen. The enteric tube is coiled once within the gastric lumen. PO contrast reaches the rectum. The bowel gas pattern is normal. No radio-opaque calculi or other significant radiographic abnormality are seen. Severe degenerative changes of the right hip. IMPRESSION: 1. Enteric tube in good position; however, could be retracted by 15 cm. 2. PO contrast reaches the rectum. 3. Nonobstructive bowel gas pattern. 4. Severe right hip degenerative changes. Electronically Signed   By: Tish Frederickson M.D.   On: 07/01/2023 20:21   DG Abd Portable 1V Result Date: 07/01/2023 CLINICAL DATA:  Check gastric catheter placement EXAM: PORTABLE ABDOMEN - 1 VIEW COMPARISON:  None Available. FINDINGS: Gastric catheter is coiled within the stomach. No free air is noted. No obstructive changes are noted. Postsurgical changes are seen. IMPRESSION: Gastric catheter within the stomach. Electronically Signed   By: Alcide Clever M.D.   On: 07/01/2023 02:39   DG C-Arm  1-60 Min-No Report Result Date: 07/01/2023 Fluoroscopy was utilized by the requesting physician.  No radiographic interpretation.   CT ABDOMEN PELVIS W CONTRAST Result Date: 06/30/2023 CLINICAL DATA:  Lower abdominal pain. Right upper quadrant pain. Nausea and vomiting. EXAM: CT ABDOMEN AND PELVIS WITH CONTRAST TECHNIQUE: Multidetector CT imaging of the abdomen and pelvis was performed using the standard protocol following bolus administration of intravenous contrast. RADIATION DOSE REDUCTION: This exam was performed according to the departmental dose-optimization program which includes automated exposure control, adjustment of the mA and/or kV according to patient size and/or use of iterative reconstruction technique. CONTRAST:  OMNIPAQUE IOHEXOL 300 MG/ML  SOLN COMPARISON:  Noncontrast CT 08/11/2016 FINDINGS: Lower chest: Upper normal heart size. Wall thickening of the distal esophagus. No basilar airspace disease or pleural effusion. Hepatobiliary: Stable cyst in the left lobe of the liver. No suspicious liver lesion. Gallbladder physiologically distended, no calcified stone. No biliary dilatation. Pancreas: Unremarkable. No pancreatic ductal dilatation or surrounding inflammatory changes. Spleen: Normal in size without focal abnormality. Adrenals/Urinary Tract: Stable left adrenal thickening.  Stable right adrenal calcifications. No further follow-up imaging is recommended. No hydronephrosis. Punctate nonobstructing stone in the upper pole of the left kidney. No suspicious renal lesion. Right renal cyst. No further follow-up imaging is recommended. Unremarkable urinary bladder. Stomach/Bowel: Wall thickening of the distal esophagus. Mild fluid-filled stomach without abnormal distension. Dilated fluid-filled small bowel with bowel wall thickening and mesenteric edema. Short segment of small bowel extends into a ventral abdominal wall hernia just to the left of midline, however the bowel both proximal and  distal to this appears inflamed. There is general transition from dilated and inflamed to nondilated in the right mid abdomen without discrete transition point. Appendectomy. Colonic diverticulosis, prominent in the left colon, without diverticulitis. Vascular/Lymphatic: Aortic and branch atherosclerosis. No aortic aneurysm. The portal vein is patent. No abdominopelvic adenopathy. Reproductive: Status post hysterectomy. No adnexal masses. Other: Complex ventral abdominal wall hernia. Left paraumbilical component contains a short segment of small bowel. There is adjacent fat stranding and inflammation within the hernia sac as well as adjacent subcutaneous tissues. Smaller portions of the hernia contains only fat. A supraumbilical component of the hernia contains anti mesenteric border of transverse colon but no associated inflammation. There is a separate upper abdominal ventral abdominal wall hernia containing only fat. Small amount of mesenteric free fluid and free fluid in the pelvis. No free air. Musculoskeletal: Scoliosis and degenerative change in the spine. Advanced right hip arthropathy. There are no acute or suspicious osseous abnormalities. IMPRESSION: 1. Dilated fluid-filled small bowel with bowel wall thickening and mesenteric inflammation. There is a short segment of small bowel extending into a ventral abdominal wall hernia, however the bowel both proximal and distal to the hernia appear inflamed. There is general transition to nondilated small bowel distally. Findings may represent early small bowel obstruction or enteritis/inflammation. 2. Complex ventral abdominal wall hernia, component containing short segment of small bowel. Portion of the hernia also contains anti mesenteric border of transverse colon. There is fat stranding within and adjacent to the hernia likely related small bowel inflammation. 3. Wall thickening of the distal esophagus, query reflux. 4. Nonobstructing left renal stone. 5.  Colonic diverticulosis without focal diverticulitis. Aortic Atherosclerosis (ICD10-I70.0). Electronically Signed   By: Narda Rutherford M.D.   On: 06/30/2023 16:07   CT HEAD WO CONTRAST ( ) Result Date: 06/07/2023 CLINICAL DATA:  Transient ischemic attack EXAM: CT HEAD WITHOUT CONTRAST TECHNIQUE: Contiguous axial images were obtained from the base of the skull through the vertex without intravenous contrast. RADIATION DOSE REDUCTION: This exam was performed according to the departmental dose-optimization program which includes automated exposure control, adjustment of the mA and/or kV according to patient size and/or use of iterative reconstruction technique. COMPARISON:  None Available. FINDINGS: Evaluation is somewhat limited by beam hardening artifact related to the patient's calvarium and mild motion. Brain: No evidence of acute infarction, hemorrhage, mass, mass effect, or midline shift. No hydrocephalus or extra-axial fluid collection. Periventricular white matter changes, likely the sequela of chronic small vessel ischemic disease. Vascular: No hyperdense vessel. Skull: Negative for fracture or focal lesion. Sinuses/Orbits: No acute finding. Other: The mastoid air cells are well aerated. IMPRESSION: No acute intracranial process. Electronically Signed   By: Wiliam Ke M.D.   On: 06/07/2023 14:17   CARDIAC CATHETERIZATION Addendum Date: 06/07/2023   Prox LAD to Mid LAD lesion is 100% stenosed.   Mid RCA lesion is 20% stenosed. 1. Normal filling pressures. 2. Mild pulmonary arterial hypertension (?related to OSA). 3. Preserved cardiac output. 4. Chronic total  occlusion of the proximal LAD after D1 and S1.  Minimal collaterals. Patient has had no chest pain, CHF symptoms have been present for several months and now improved with treatment.  Suspect unrecognized prior MI.  I do not think there is benefit to attempting to open this chronically occluded vessel.  Result Date: 06/07/2023   Prox LAD to  Mid LAD lesion is 100% stenosed.   Mid RCA lesion is 20% stenosed. 1. Normal filling pressures. 2. Mild pulmonary arterial hypertension (?related to OSA). 3. Preserved cardiac output. 4. Chronic total occlusion of the proximal LAD after D1 and S1.  Minimal collaterals. Patient has no chest pain, CHF symptoms have been present for several months.  Suspect unrecognized prior MI.    Microbiology: No results found for this or any previous visit.  Labs: CBC: Recent Labs  Lab 06/30/23 1316 07/01/23 0338 07/02/23 0544 07/03/23 0505 07/04/23 0557  WBC 8.2 8.2 8.8 7.9 7.3  HGB 16.0* 13.2 12.5 11.9* 12.4  HCT 49.3* 41.3 39.4 37.2 38.7  MCV 87.9 88.8 89.3 89.4 89.0  PLT 337 302 267 258 294   Basic Metabolic Panel: Recent Labs  Lab 06/30/23 1316 07/01/23 0338 07/02/23 0544 07/03/23 0505 07/04/23 0557  NA 137 140 140 141 141  K 4.6 4.0 3.6 3.3* 3.5  CL 100 104 102 100 100  CO2 23 25 29 30 30   GLUCOSE 169* 190* 177* 140* 156*  BUN 21 21 14 15 15   CREATININE 0.59 0.57 0.47 0.57 0.46  CALCIUM 9.2 8.7* 8.4* 8.4* 8.6*  MG  --  1.8  --   --  2.0  PHOS  --  5.1*  --   --  2.0*   Liver Function Tests: Recent Labs  Lab 06/30/23 1316 07/01/23 0338 07/04/23 0557  AST 23 18  --   ALT 10 16  --   ALKPHOS 41 39  --   BILITOT 1.7* 0.9  --   PROT 6.8 6.1*  --   ALBUMIN 3.7 3.4* 2.8*   CBG: Recent Labs  Lab 07/03/23 1115 07/03/23 1608 07/03/23 2100 07/04/23 0804 07/04/23 1117  GLUCAP 197* 140* 134* 135* 178*    Discharge time spent: greater than 30 minutes.  Signed: Pennie Banter, DO [facility] 07/04/2023

## 2023-07-04 NOTE — Consult Note (Signed)
 PHARMACY CONSULT NOTE - FOLLOW UP  Pharmacy Consult for Electrolyte Monitoring and Replacement   Recent Labs: Potassium (mmol/L)  Date Value  07/04/2023 3.5  04/11/2013 3.5   Magnesium (mg/dL)  Date Value  16/02/9603 2.0   Calcium (mg/dL)  Date Value  54/01/8118 8.6 (L)   Calcium, Total (mg/dL)  Date Value  14/78/2956 8.0 (L)   Albumin (g/dL)  Date Value  21/30/8657 2.8 (L)  09/09/2012 2.7 (L)   Phosphorus (mg/dL)  Date Value  84/69/6295 2.0 (L)   Sodium (mmol/L)  Date Value  07/04/2023 141  06/20/2023 142  04/11/2013 138     Assessment: 78 y.o. female with medical history significant of HFpEF with last EF of 25-30%, obstructive CAD, pheochromocytoma s/p right adrenalectomy, rheumatoid arthritis, type 2 diabetes, mild pulmonary hypertension, endometrial cancer (2010), hyperlipidemia, who presents to the ED due to abdominal pain   Lasix IV x 1 2/18.   Goal of Therapy:  WNL  Plan:  Phos 2.0 - give K phos neutral 500 mg PO x1 F/u with AM labs.   Rockwell Alexandria ,PharmD Clinical Pharmacist 07/04/2023 11:08 AM

## 2023-07-04 NOTE — Progress Notes (Incomplete)
 {  Select_TRH_Note:[id number]}

## 2023-07-04 NOTE — Progress Notes (Signed)
 Second attempt to call report to facility

## 2023-07-04 NOTE — Progress Notes (Signed)
 Pt discharged to facility today at 1500 via EMS. Report was given to receiving RN at facility. AVS packet given to EMS. PIV removed. Slide transfer from bed to stretcher. All belongings sent with son.

## 2023-07-04 NOTE — Progress Notes (Signed)
 Attempted to call report to facility. No answer at this time. Will try again.

## 2023-07-04 NOTE — Progress Notes (Signed)
 Patient ID: Tammy Ashley, female   DOB: [birth year], 78 y.o.   MRN: [id number]     SURGICAL PROGRESS NOTE   Hospital Day(s): 4.   Interval History: Patient seen and examined, no acute events or new complaints overnight. Patient reports feeling okay this morning.  She does have difficulty sleeping but otherwise no complaint from surgery.  She tolerated soft diet.  No nausea or vomiting.  Continue passing gas and having bowel movements.  Vital signs in last 24 hours: [min-max] current  Temp:  [97.4 F (36.3 C)-98.3 F (36.8 C)] 97.9 F (36.6 C) (02/19 0329) Pulse Rate:  [93-109] 93 (02/19 0329) Resp:  [18-19] 18 (02/19 0329) BP: (110-124)/(45-79) 115/79 (02/19 0329) SpO2:  [95 %-98 %] 95 % (02/19 0329) Weight:  [69.3 kg] 69.3 kg (02/19 0331)     Height: 5' (152.4 cm) Weight: 69.3 kg BMI (Calculated): 29.84   Physical Exam:  Constitutional: alert, cooperative and no distress  Respiratory: breathing non-labored at rest  Cardiovascular: regular rate and sinus rhythm  Gastrointestinal: soft, non-tender, and non-distended.  Incisions are dry and clean  Labs:     Latest Ref Rng & Units 07/04/2023    5:57 AM 07/03/2023    5:05 AM 07/02/2023    5:44 AM  CBC  WBC 4.0 - 10.5 K/uL 7.3  7.9  8.8   Hemoglobin 12.0 - 15.0 g/dL 40.9  81.1  91.4   Hematocrit 36.0 - 46.0 % 38.7  37.2  39.4   Platelets 150 - 400 K/uL 294  258  267       Latest Ref Rng & Units 07/03/2023    5:05 AM 07/02/2023    5:44 AM 07/01/2023    3:38 AM  CMP  Glucose 70 - 99 mg/dL 782  956  213   BUN 8 - 23 mg/dL 15  14  21    Creatinine 0.44 - 1.00 mg/dL 0.86  5.78  4.69   Sodium 135 - 145 mmol/L 141  140  140   Potassium 3.5 - 5.1 mmol/L 3.3  3.6  4.0   Chloride 98 - 111 mmol/L 100  102  104   CO2 22 - 32 mmol/L 30  29  25    Calcium 8.9 - 10.3 mg/dL 8.4  8.4  8.7   Total Protein 6.5 - 8.1 g/dL   6.1   Total Bilirubin 0.0 - 1.2 mg/dL   0.9   Alkaline Phos 38 - 126 U/L   39   AST 15 - 41 U/L   18   ALT 0 - 44 U/L    16     Imaging studies: No new pertinent imaging studies   Assessment/Plan:  78 y.o. female with strangulating incisional hernia 4 Day Post-Op s/p small bowel resection and repair of incisional hernia, complicated by pertinent comorbidities including uncontrolled CHF, coronary artery disease, diabetes.   -Patient continue adequate progress -No clinical toleration.  Stable vital signs.  No fever -She is tolerating soft diet.  Having good bowel movements -Significant weakness from acute over chronic medical conditions after surgery -Patient will benefit of skilled nursing facility for physical therapy rehab -No further surgical intervention needed at this moment.  No contraindication to discharge to skilled nursing facility from surgical standpoint once medically stable -Will continue to follow while she is in-house.  Gae Gallop, MD

## 2023-07-05 ENCOUNTER — Other Ambulatory Visit: Payer: Medicare Other

## 2023-07-08 ENCOUNTER — Telehealth: Payer: Self-pay | Admitting: Cardiology

## 2023-07-08 NOTE — Telephone Encounter (Signed)
   Patient reached out with concerns of hypotension. She is a patient of Dr. Shirlee Latch who last saw the patient on 06/20/2023. At that time her regimen for her HFrEF was: Entresto 24/26 mg BID, Toprol 12.5 mg daily, spironolactone 12.5 mg daily, Farxiga 10 mg daily, Lasix 20 mg PRN. At this last visit her BP was 116/69 but patient had not experience any symptoms of hypotension.   She was recently admitted to the hospital 2/15-2/19 for an SBO secondary to a strangulated incisional hernia requiring surgery. Upon discharge to SNF the patients medications were changed: they held spironolactone, Entresto and PO Lasix, switched from Toprol to Lopressor 25 mg BID.   Patient has been at [facility] since discharge on 2/19. I spoke with patient, her family, and SNF nursing staff for over 45 minutes today. During this time, her son expressed concern regarding recent BP readings and reports of the patient being dizzy. While speaking with the son he says that his mother has not been getting treated with PT because when they come into her room they take her BP and it is too low for treatment. He believes her BP was 97/49 today with HR remaining in the 80s. He was unsure of what medications she was actually receiving which led me to speak with Jon Gills, one of the nurses at the facility.   The nurse confirmed that patient was getting Entresto 24-26 mg BID and Lopressor 25 mg BID. She claims that the patients BP is typically 120s/60s with HR in the 70s. She states that the patient has had some brief episodes of hypotension, with BP 90/52 as the lowest reading they have and she states that they "hold medications" when that happens but it is unclear if it is Lopressor or Sherryll Burger that is being held.  There seems to be a decent amount of discrepancy when it comes to BP readings and sessions with PT. The son states PT says her BP is too low to work with and that she was not being seen by PT where nursing staff at SNF states that she was  worked with today 2/23 where PT walked her down the hall.   Instructed SNF to obtain orthostatic vital signs as patient reports that she is mainly dizzy when she goes from sitting/laying to standing up. She denies any syncope. Discussed measures to reduce orthostasis, including going from sitting to standing slowly, wearing compression stockings, ensure she is keeping up with PO intake (which has not been an issue per son). Instructed patient's family members to take an independent log of the patients BP if they believe it is being charted inaccurately, implement measures to help with possible orthostatic hypotension and let us know if there are positive orthostatic vitals.   Patient has a close follow up already scheduled on 3/7 with Dr. Shirlee Latch. Instructed patient to bring log of BP to this visit to ensure that we are adjusting medications as necessary.   Olena Leatherwood, PA-C 07/08/2023 2:17 PM

## 2023-07-17 ENCOUNTER — Telehealth: Payer: Self-pay | Admitting: Cardiology

## 2023-07-17 NOTE — Telephone Encounter (Signed)
 Unable to confirm appt pt vm not setup

## 2023-07-18 ENCOUNTER — Ambulatory Visit (HOSPITAL_BASED_OUTPATIENT_CLINIC_OR_DEPARTMENT_OTHER): Payer: Medicare Other | Admitting: Cardiology

## 2023-07-18 ENCOUNTER — Other Ambulatory Visit
Admission: RE | Admit: 2023-07-18 | Discharge: 2023-07-18 | Disposition: A | Discharge status: Home / Self Care | Source: Ambulatory Visit | Attending: Cardiology | Admitting: Cardiology

## 2023-07-18 VITALS — BP 112/62 | HR 95 | Wt 151.0 lb

## 2023-07-18 DIAGNOSIS — Z79899 Other long term (current) drug therapy: Secondary | ICD-10-CM | POA: Diagnosis not present

## 2023-07-18 DIAGNOSIS — I2510 Atherosclerotic heart disease of native coronary artery without angina pectoris: Secondary | ICD-10-CM | POA: Insufficient documentation

## 2023-07-18 DIAGNOSIS — I110 Hypertensive heart disease with heart failure: Secondary | ICD-10-CM | POA: Insufficient documentation

## 2023-07-18 DIAGNOSIS — Z7984 Long term (current) use of oral hypoglycemic drugs: Secondary | ICD-10-CM | POA: Insufficient documentation

## 2023-07-18 DIAGNOSIS — E119 Type 2 diabetes mellitus without complications: Secondary | ICD-10-CM | POA: Insufficient documentation

## 2023-07-18 DIAGNOSIS — R0602 Shortness of breath: Secondary | ICD-10-CM | POA: Diagnosis present

## 2023-07-18 DIAGNOSIS — E785 Hyperlipidemia, unspecified: Secondary | ICD-10-CM | POA: Insufficient documentation

## 2023-07-18 DIAGNOSIS — I2721 Secondary pulmonary arterial hypertension: Secondary | ICD-10-CM | POA: Diagnosis not present

## 2023-07-18 DIAGNOSIS — Z86018 Personal history of other benign neoplasm: Secondary | ICD-10-CM | POA: Insufficient documentation

## 2023-07-18 DIAGNOSIS — M069 Rheumatoid arthritis, unspecified: Secondary | ICD-10-CM | POA: Diagnosis not present

## 2023-07-18 DIAGNOSIS — I255 Ischemic cardiomyopathy: Secondary | ICD-10-CM | POA: Insufficient documentation

## 2023-07-18 DIAGNOSIS — I5022 Chronic systolic (congestive) heart failure: Secondary | ICD-10-CM

## 2023-07-18 DIAGNOSIS — Z7902 Long term (current) use of antithrombotics/antiplatelets: Secondary | ICD-10-CM | POA: Insufficient documentation

## 2023-07-18 LAB — BASIC METABOLIC PANEL
Anion gap: 8 (ref 5–15)
BUN: 9 mg/dL (ref 8–23)
CO2: 33 mmol/L — ABNORMAL HIGH (ref 22–32)
Calcium: 8.3 mg/dL — ABNORMAL LOW (ref 8.9–10.3)
Chloride: 102 mmol/L (ref 98–111)
Creatinine, Ser: 0.42 mg/dL — ABNORMAL LOW (ref 0.44–1.00)
GFR, Estimated: >60 mL/min (ref >60)
Glucose, Bld: 117 mg/dL — ABNORMAL HIGH (ref 70–99)
Potassium: 3.7 mmol/L (ref 3.5–5.1)
Sodium: 143 mmol/L (ref 135–145)

## 2023-07-18 LAB — BRAIN NATRIURETIC PEPTIDE: B Natriuretic Peptide: 573.8 pg/mL — ABNORMAL HIGH (ref 0.0–100.0)

## 2023-07-18 MED ORDER — SPIRONOLACTONE 25 MG PO TABS: 12.5000 mg | ORAL_TABLET | Freq: Every evening | ORAL | 3 refills | Status: DC

## 2023-07-18 MED ORDER — POTASSIUM CHLORIDE ER 10 MEQ PO TBCR: 10.0000 meq | EXTENDED_RELEASE_TABLET | Freq: Every day | ORAL | 3 refills | Status: DC

## 2023-07-18 MED ORDER — FUROSEMIDE 20 MG PO TABS: 20.0000 mg | ORAL_TABLET | Freq: Every day | ORAL | 3 refills | Status: DC

## 2023-07-18 MED ORDER — METOPROLOL SUCCINATE ER 25 MG PO TB24: 12.5000 mg | ORAL_TABLET | Freq: Every evening | ORAL | 3 refills | Status: DC

## 2023-07-18 NOTE — Patient Instructions (Signed)
 STOP metoprolol Tartrate  START Toprol XL 12.5 mg ( 1/2 tab) nightly.  START Spironolactone 12.5 mg ( 1/2 Tab) nightly  START Lasix 20 mg daily.  START Potassium 10 mEq ( 1 tab) daily.  Go over to the [facility]. Go pass the gift shop and have your blood work completed.  We will only call you if the results are abnormal or if the provider would like to make medication changes.   LOW SODIUM DIET < 2,000 MG DAILY AND A DIABETIC DIET.  PLEASE WEAR GRADED COMPRESSION STOCKINGS.  Your physician recommends that you schedule a follow-up appointment in: 2 WEEKS.  At the [facility], you and your health needs are our priority. As part of our continuing mission to provide you with exceptional heart care, we have created designated Provider Care Teams. These Care Teams include your primary Cardiologist (physician) and Advanced Practice Providers (APPs- Physician Assistants and Nurse Practitioners) who all work together to provide you with the care you need, when you need it.   You may see any of the following providers on your designated Care Team at your next follow up: Dr Arvilla Meres Dr Marca Ancona Dr. Dorthula Nettles Dr. Clearnce Hasten Amy Filbert Schilder, NP Robbie Lis, [place] [facility] [place], [place] Brynda Peon, NP [place] Lee, NP Clarisa Kindred, NP Karle Plumber, PharmD Enos Fling, PharmD   Please be sure to bring in all your medications bottles to every appointment.    Thank you for choosing [facility]

## 2023-07-19 NOTE — Progress Notes (Signed)
 PCP: Adrian Prince, MD HF Cardiology: Dr. Shirlee Latch  Chief Complaint: CHF  78 y.o. with history of DM2, rheumatoid arthritis, pheochromocytoma s/p right adrenalectomy was referred by Dr. Evlyn Kanner for evaluation of CHF.  No prior cardiac history.  For several months prior to initial visit, she had noted exertional dyspnea, and for 2-3 months she had had lower leg swelling.  She is also limited chronically by joint pain from rheumatoid arthritis.   Echo was done in 12/24, showing EF 25-30%, moderate RV dysfunction, mild RV enlargement.   She had RHC/LHC in 1/25, showing normal filling pressures, mild pulmonary hypertension and preserved CO.  The proximally LAD was chronically totally occluded with minimal collaterals.   Patient was admitted in 2/25 with SBO from strangulated incisional hernia.  She had bowel resection and re-anastomosis.  Entresto and spironolactone were stopped due to low BP.  She was sent to SNF for rehab.   She returns today for followup of CHF and CAD. She remains in SNF post-SBO surgery, but should be going home next week.  She has been on 2L home oxygen since hospitalization.  She is walking with her walker but gets short of breath walking more than 10-20 feet.  She has her baseline knee arthritis which is significantly limiting. Weight is down, but she has developed significant peripheral edema.  She is getting a high sodium diet. SBP 90s-100s at SNF.  She gets mildly lightheaded if she stands up too fast.  No orthopnea/PND.  No chest pain.  No palpitations.   ECG (personally reviewed): NSR, LAFB, RBBB  Labs (5/24): K 4, creatinine 0.7, LDL 77 Labs (10/24): TSH normal, hgb 14.5 Labs (1/25): K 3.7, creatinine 0.54 Labs (2/25): K 3.5, creatinine 0.46, Lp(a) 16, LDL 77  PMH: 1. Type 2 diabetes 2. Hyperlipidemia 3. 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  5. Endometrial cancer: 2010.  6. Chronic systolic CHF: Echo  (12/24) with EF 25-30%, moderate RV dysfunction, mild RV enlargement.  - LHC/RHC (1/25): Mean RA 6, PA 44/16, mean PCWP 8, CI 2.96; occluded proximal LAD (chronic).  7. CAD: Cath in 1/25 with chronically occluded proximal LAD.  8. SBO: 2/25, due to strangulated incisional hernia, s/p bowel resection and reanastomosis.   FH: Brother with PCI  SH: Originally from [place], lives in [place]. Widow.  No smoking or ETOH. 2 children.   ROS: All systems reviewed and negative except as per HPI.   Current Outpatient Medications  Medication Sig Dispense Refill   Abatacept (ORENCIA) 125 MG/ML SOSY Inject 125 mg into the skin every 30 (thirty) days.     acetaminophen (TYLENOL) 650 MG CR tablet Take 1,300 mg by mouth every morning.     atorvastatin (LIPITOR) 80 MG tablet Take 1 tablet (80 mg total) by mouth daily. 90 tablet 3   clopidogrel (PLAVIX) 75 MG tablet Take 1 tablet (75 mg total) by mouth daily. 30 tablet 11   CONTOUR NEXT TEST test strip USE TO SELF MONITOR BLOOD GLUCOSE DAILY DX E11.9     cycloSPORINE (RESTASIS) 0.05 % ophthalmic emulsion Place 1 drop into both eyes 2 (two) times daily.      dextromethorphan-guaiFENesin (MUCINEX DM) 30-600 MG 12hr tablet Take 1 tablet by mouth 2 (two) times daily.     diphenhydramine-acetaminophen (TYLENOL PM) 25-500 MG TABS tablet Take 1-2 tablets by mouth at bedtime as needed.     ergocalciferol (VITAMIN D2) 50000 units capsule Take 50,000 Units by mouth once a week.  FARXIGA 10 MG TABS tablet Take 1 tablet (10 mg total) by mouth daily before breakfast. 90 tablet 3   furosemide (LASIX) 20 MG tablet Take 1 tablet (20 mg total) by mouth daily. 90 tablet 3   ipratropium-albuterol (DUONEB) 0.5-2.5 (3) MG/3ML SOLN Take 3 mLs by nebulization every 6 (six) hours as needed.     leflunomide (ARAVA) 10 MG tablet Take 10 mg by mouth daily.     melatonin 5 MG TABS Take 1 tablet (5 mg total) by mouth at bedtime.     metFORMIN (GLUCOPHAGE-XR) 500 MG 24 hr tablet Take  500 mg by mouth 2 (two) times daily.     metoprolol succinate (TOPROL XL) 25 MG 24 hr tablet Take 0.5 tablets (12.5 mg total) by mouth at bedtime. 45 tablet 3   Multiple Vitamins-Minerals (MULTIVITAMIN WITH MINERALS) tablet Take 1 tablet by mouth daily.     ondansetron (ZOFRAN) 4 MG tablet Take 1 tablet (4 mg total) by mouth every 6 (six) hours as needed for nausea.     potassium chloride (KLOR-CON) 10 MEQ tablet Take 1 tablet (10 mEq total) by mouth daily. 90 tablet 3   spironolactone (ALDACTONE) 25 MG tablet Take 0.5 tablets (12.5 mg total) by mouth at bedtime. 45 tablet 3   No current facility-administered medications for this visit.   BP 112/62   Pulse 95   Wt 151 lb (68.5 kg)   SpO2 98%   BMI 29.49 kg/m  General: NAD Neck: JVP 8-9, no thyromegaly or thyroid nodule.  Lungs: Mild crackles at bases CV: Nondisplaced PMI.  Heart regular S1/S2, no S3/S4, no murmur.  1+ edema 1/2 up lower legs.  No carotid bruit.  Normal pedal pulses.  Abdomen: Soft, nontender, no hepatosplenomegaly, no distention.  Skin: Intact without lesions or rashes.  Neurologic: Alert and oriented x 3.  Psych: Normal affect. Extremities: No clubbing or cyanosis.  HEENT: Normal.   Assessment/Plan: 1. Chronic systolic CHF: Ischemic cardiomyopathy.  Echo in 12/24 showed EF 25-30%, moderate RV dysfunction, mild RV enlargement.  LHC/RHC in 1/25 showed occluded proximal LAD that is likely the culprit for her cardiomyopathy.  RHC showed normal filling pressure, preserved cardiac output, mild pulmonary arterial hypertension.  She is now off Entresto, Lasix, and spironolactone post-hospitalization for SBO.  Her BP is still relatively soft.  She does looks volume overloaded on exam.   - Restart Lasix 20 mg daily with KCl 10 daily.  BMET/BNP today and BMET 10 days.    - Continue Farxiga 10 mg daily.  - Contineu Entresto 24/26 bid.  - Stop metoprolol tartrate and start back on Toprol XL 12.5 daily.  - Start spironolactone  12.5 at bedtime.  - I will not start Entresto yet, continue to follow BP and hopefully restart next.   - Repeat echo in 2 more months after medication titration.  If EF remains < 35%, will need to consider ICD.  Narrow QRS, not CRT candidate.  - Wear graded compression stockings.  2. HTN: BP is now on the lower side.   3. H/o right adrenal pheochromocytoma: S/p right adrenalectomy.  4. Hyperlipidemia: Continue atorvastatin.  - LDL in 2/25 was above goal at 77, when she gets out of SNF will start her on Repatha.   5. Rheumatoid arthritis: This limits her ambulation due to joint pain.  6. CAD: LHC in 1/25 with chronic occlusion of the proximal LAD without significant collaterals.  No target for PCI or CABG.  She does not have a  history of a severe chest pain event. Needs aggressive secondary prevention.  - Continue Plavix.  - Continue atorvastatin, will eventually start Repatha as above.   Followup in 2 wks with NP   I spent 32 minutes reviewing records, interviewing/examining patient, and managing orders.   Marca Ancona 07/19/2023

## 2023-07-20 ENCOUNTER — Encounter: Payer: Medicare Other | Admitting: Cardiology

## 2023-07-30 ENCOUNTER — Telehealth: Payer: Self-pay | Admitting: Pharmacist

## 2023-07-30 ENCOUNTER — Telehealth: Payer: Self-pay | Admitting: Family

## 2023-07-30 NOTE — Telephone Encounter (Signed)
 Answered medication questions for the patient's daughter/caregiver. Patient confirmed visit with [facility] tomorrow.

## 2023-07-30 NOTE — Progress Notes (Unsigned)
 [facility] Note    PCP: Adrian Prince, MD Cardiologist: None  HF provider: Marca Ancona, MD  Chief Complaint: shortness of breath  HPI:  Tammy Ashley is a 78 y.o. with history of DM2, rheumatoid arthritis, pheochromocytoma s/p right adrenalectomy was referred by Dr. Evlyn Kanner for evaluation of CHF.  No prior cardiac history.  For several months prior to initial visit, she had noted exertional dyspnea, and for 2-3 months she had had lower leg swelling.  She is also limited chronically by joint pain from rheumatoid arthritis.   Echo was done in 12/24, showing EF 25-30%, moderate RV dysfunction, mild RV enlargement.   She had RHC/LHC in 1/25, showing normal filling pressures, mild pulmonary hypertension and preserved CO.  The proximally LAD was chronically totally occluded with minimal collaterals.   Patient was admitted in 0/25 with SBO from strangulated incisional hernia.  She had bowel resection and re-anastomosis.  Entresto and spironolactone were stopped due to low BP.  She was sent to SNF for rehab.   She presents, with her son, today for a HF follow-up visit with a chief complaint of minimal shortness of breath (oxygen at 2L). Has fatigue, occasional palpitations, pedal edema (improving), diarrhea & dizziness along with this. Denies chest pain, abdominal distention, difficulty sleeping or weight gain. Sleeping ok with tylenol PM/ melatonin   At last visit, spironolactone 12.5mg  daily was started. Home SBP ranges from mid 90's- mid 110's   ROS: All systems negative except what is listed in HPI, PMH and Problem List  Past Medical History:  Diagnosis Date   Arthritis    Cancer (HCC)    Endometrial   Cataract    CHF (congestive heart failure) (HCC)    Coronary artery disease    Diabetes mellitus without complication (HCC)    Hyperlipidemia    Pulmonary hypertension (HCC)    Rheumatoid arthritis (HCC)     Current Outpatient Medications  Medication Sig  Dispense Refill   Abatacept (ORENCIA) 125 MG/ML SOSY Inject 125 mg into the skin every 30 (thirty) days.     acetaminophen (TYLENOL) 650 MG CR tablet Take 1,300 mg by mouth every morning.     atorvastatin (LIPITOR) 80 MG tablet Take 1 tablet (80 mg total) by mouth daily. 90 tablet 3   clopidogrel (PLAVIX) 75 MG tablet Take 1 tablet (75 mg total) by mouth daily. 30 tablet 11   CONTOUR NEXT TEST test strip USE TO SELF MONITOR BLOOD GLUCOSE DAILY DX E11.9     cycloSPORINE (RESTASIS) 0.05 % ophthalmic emulsion Place 1 drop into both eyes 2 (two) times daily.      dextromethorphan-guaiFENesin (MUCINEX DM) 30-600 MG 12hr tablet Take 1 tablet by mouth 2 (two) times daily.     diphenhydramine-acetaminophen (TYLENOL PM) 25-500 MG TABS tablet Take 1-2 tablets by mouth at bedtime as needed.     ergocalciferol (VITAMIN D2) 50000 units capsule Take 50,000 Units by mouth once a week.     FARXIGA 10 MG TABS tablet Take 1 tablet (10 mg total) by mouth daily before breakfast. 90 tablet 3   furosemide (LASIX) 20 MG tablet Take 1 tablet (20 mg total) by mouth daily. 90 tablet 3   ipratropium-albuterol (DUONEB) 0.5-2.5 (3) MG/3ML SOLN Take 3 mLs by nebulization every 6 (six) hours as needed.     leflunomide (ARAVA) 10 MG tablet Take 10 mg by mouth daily.     melatonin 5 MG TABS Take 1 tablet (5 mg total) by mouth at bedtime.  metFORMIN (GLUCOPHAGE-XR) 500 MG 24 hr tablet Take 500 mg by mouth 2 (two) times daily.     metoprolol succinate (TOPROL XL) 25 MG 24 hr tablet Take 0.5 tablets (12.5 mg total) by mouth at bedtime. 45 tablet 3   Multiple Vitamins-Minerals (MULTIVITAMIN WITH MINERALS) tablet Take 1 tablet by mouth daily.     ondansetron (ZOFRAN) 4 MG tablet Take 1 tablet (4 mg total) by mouth every 6 (six) hours as needed for nausea.     potassium chloride (KLOR-CON) 10 MEQ tablet Take 1 tablet (10 mEq total) by mouth daily. 90 tablet 3   spironolactone (ALDACTONE) 25 MG tablet Take 0.5 tablets (12.5 mg  total) by mouth at bedtime. 45 tablet 3   No current facility-administered medications for this visit.    Allergies  Allergen Reactions   Hydrocodone-Acetaminophen Hives   Oxycodone-Acetaminophen Hives   Aspirin Rash    Other reaction(s): Unknown      Social History   Socioeconomic History   Marital status: Married    Spouse name: Not on file   Number of children: Not on file   Years of education: Not on file   Highest education level: Not on file  Occupational History   Not on file  Tobacco Use   Smoking status: Former   Smokeless tobacco: Never  Substance and Sexual Activity   Alcohol use: Not Currently   Drug use: Not on file   Sexual activity: Not on file  Other Topics Concern   Not on file  Social History Narrative   Not on file   Social Drivers of Health   Financial Resource Strain: Medium Risk (05/25/2023)   Overall Financial Resource Strain (CARDIA)    Difficulty of Paying Living Expenses: Somewhat hard  Food Insecurity: No Food Insecurity (07/01/2023)   Hunger Vital Sign    Worried About Running Out of Food in the Last Year: Never true    Ran Out of Food in the Last Year: Never true  Transportation Needs: No Transportation Needs (07/01/2023)   PRAPARE - Administrator, Civil Service (Medical): No    Lack of Transportation (Non-Medical): No  Physical Activity: Not on file  Stress: Not on file  Social Connections: Unknown (07/01/2023)   Social Connection and Isolation Panel [NHANES]    Frequency of Communication with Friends and Family: More than three times a week    Frequency of Social Gatherings with Friends and Family: More than three times a week    Attends Religious Services: Patient declined    Database administrator or Organizations: Patient declined    Attends Banker Meetings: Patient declined    Marital Status: Married  Catering manager Violence: Not At Risk (07/01/2023)   Humiliation, Afraid, Rape, and Kick  questionnaire    Fear of Current or Ex-Partner: No    Emotionally Abused: No    Physically Abused: No    Sexually Abused: No     No family history on file.   Vitals:   07/31/23 1525  BP: 114/65  Pulse: 95  SpO2: 95%  Weight: 146 lb 6.4 oz (66.4 kg)   Wt Readings from Last 3 Encounters:  07/31/23 146 lb 6.4 oz (66.4 kg)  07/18/23 151 lb (68.5 kg)  07/04/23 152 lb 12.5 oz (69.3 kg)   Lab Results  Component Value Date   CREATININE 0.42 (L) 07/18/2023   CREATININE 0.46 07/04/2023   CREATININE 0.57 07/03/2023    PHYSICAL EXAM:  General: Well  appearing. No resp difficulty HEENT: normal Neck: supple, no JVD Cor: Regular rhythm, rate. No rubs, gallops or murmurs Lungs: clear Abdomen: soft, nontender, nondistended. Extremities: no cyanosis, clubbing, rash, trace pitting edema bilateral lower legs Neuro: alert & oriented X 3. Moves all 4 extremities w/o difficulty. Affect pleasant   ECG: not done   ASSESSMENT & PLAN:  1. Ischemic cardiomyopathy with reduced ejection fraction: - Echo in 12/24 showed EF 25-30%, moderate RV dysfunction, mild RV enlargement.   - LHC/RHC in 1/25 showed occluded proximal LAD that is likely the culprit for her cardiomyopathy. RHC showed normal filling pressure, preserved cardiac output, mild pulmonary arterial hypertension.  - NYHA class II - euvolemic - weight down 5 pounds from last visit here 2 weeks ago - Continue Farxiga 10 mg daily.  - Continue Lasix 20 mg daily / potassium daily - Continue Toprol XL 12.5 daily.  - Continue spironolactone 12.5 daily  - discussed using low dose losartan with possible transition to entresto but am concerned about BP readings at home so will defer for now - BMET today - Repeat echo in 3 more months after medication titration (~ 06/25)  If EF remains < 35%, will need to consider ICD.  Narrow QRS, not CRT candidate.  - was referred to cardiac rehab at last visit - getting home PT / OT / RN  - BNP  07/18/23 was 573.8  2. HTN: - BP 114/65 - sees PCP Evlyn Kanner) - BMET 07/18/23 reviewed and showed sodium 143, potassium 3.7, creatinine 0.42 & GFR >60 - BMET today  3. H/o right adrenal pheochromocytoma:  - S/p right adrenalectomy.   4. Hyperlipidemia:  - Continue atorvastatin.80mg  daily  - LDL 06/20/23 was 77 - lipo (a) 06/20/23 was 16.0 - Want LDL < 55.  - lipid panel today to recheck this after being on 80mg  atorvastatin and back home on her diet (had gotten quite a bit of fried foods at rehab) - could consider adding zetia in the future but patient is hesitate to take any more medications  5. Rheumatoid arthritis:  - This limits her ambulation due to joint pain.   6. CAD:  - LHC in 1/25 with chronic occlusion of the proximal LAD without significant collaterals.  No target for PCI or CABG.  She does not have a history of a severe chest pain event. Needs aggressive secondary prevention.  - Continue Plavix.75mg  daily - Continue atorvastatin 80mg  daily   Return in 1 month, sooner if needed.   Delma Freeze, FNP 07/30/23

## 2023-07-30 NOTE — Telephone Encounter (Signed)
 Pt confirmed appt 07/31/23

## 2023-07-31 ENCOUNTER — Ambulatory Visit (HOSPITAL_BASED_OUTPATIENT_CLINIC_OR_DEPARTMENT_OTHER): Admitting: Family

## 2023-07-31 ENCOUNTER — Encounter: Payer: Self-pay | Admitting: Family

## 2023-07-31 ENCOUNTER — Other Ambulatory Visit
Admission: RE | Admit: 2023-07-31 | Discharge: 2023-07-31 | Disposition: A | Discharge status: Home / Self Care | Source: Ambulatory Visit | Attending: Family | Admitting: Family

## 2023-07-31 VITALS — BP 114/65 | HR 95 | Wt 146.4 lb

## 2023-07-31 DIAGNOSIS — Z0389 Encounter for observation for other suspected diseases and conditions ruled out: Secondary | ICD-10-CM | POA: Diagnosis present

## 2023-07-31 DIAGNOSIS — M069 Rheumatoid arthritis, unspecified: Secondary | ICD-10-CM

## 2023-07-31 DIAGNOSIS — I5022 Chronic systolic (congestive) heart failure: Secondary | ICD-10-CM

## 2023-07-31 DIAGNOSIS — D3501 Benign neoplasm of right adrenal gland: Secondary | ICD-10-CM

## 2023-07-31 DIAGNOSIS — E782 Mixed hyperlipidemia: Secondary | ICD-10-CM

## 2023-07-31 DIAGNOSIS — I2510 Atherosclerotic heart disease of native coronary artery without angina pectoris: Secondary | ICD-10-CM

## 2023-07-31 DIAGNOSIS — I10 Essential (primary) hypertension: Secondary | ICD-10-CM

## 2023-07-31 LAB — LIPID PANEL
Cholesterol: 124 mg/dL (ref 0–200)
HDL: 39 mg/dL — ABNORMAL LOW (ref >40)
LDL Cholesterol: 41 mg/dL (ref 0–99)
Total CHOL/HDL Ratio: 3.2 ratio
Triglycerides: 219 mg/dL — ABNORMAL HIGH (ref <150)
VLDL: 44 mg/dL — ABNORMAL HIGH (ref 0–40)

## 2023-07-31 LAB — BASIC METABOLIC PANEL
Anion gap: 14 (ref 5–15)
BUN: 17 mg/dL (ref 8–23)
CO2: 26 mmol/L (ref 22–32)
Calcium: 9.2 mg/dL (ref 8.9–10.3)
Chloride: 99 mmol/L (ref 98–111)
Creatinine, Ser: 0.47 mg/dL (ref 0.44–1.00)
GFR, Estimated: >60 mL/min (ref >60)
Glucose, Bld: 132 mg/dL — ABNORMAL HIGH (ref 70–99)
Potassium: 3.9 mmol/L (ref 3.5–5.1)
Sodium: 139 mmol/L (ref 135–145)

## 2023-07-31 NOTE — Patient Instructions (Addendum)
 Medication Changes:  No medication changes  Lab Work:  Go over to the [facility]. Go pass the gift shop and have your blood work completed.  We will only call you if the results are abnormal or if the provider would like to make medication changes.   Follow-Up in: 1 month with Dr. Shirlee Latch.  At the [facility], you and your health needs are our priority. We have a designated team specialized in the treatment of Heart Failure. This Care Team includes your primary Heart Failure Specialized Cardiologist (physician), Advanced Practice Providers (APPs- Physician Assistants and Nurse Practitioners), and Pharmacist who all work together to provide you with the care you need, when you need it.   You may see any of the following providers on your designated Care Team at your next follow up:  Dr. Arvilla Meres Dr. Marca Ancona Dr. Dorthula Nettles Dr. Theresia Bough Clarisa Kindred, FNP Enos Fling, RPH-CPP  Please be sure to bring in all your medications bottles to every appointment.   Need to Contact us:  If you have any questions or concerns before your next appointment please send [place] a message through [place] or call our office at [phone].    TO LEAVE A MESSAGE FOR THE NURSE SELECT OPTION 2, PLEASE LEAVE A MESSAGE INCLUDING: YOUR NAME DATE OF BIRTH CALL BACK NUMBER REASON FOR CALL**this is important as we prioritize the call backs  YOU WILL RECEIVE A CALL BACK THE SAME DAY AS LONG AS YOU CALL BEFORE 4:00 PM

## 2023-08-01 ENCOUNTER — Encounter: Payer: Self-pay | Admitting: Family

## 2023-08-15 ENCOUNTER — Telehealth: Payer: Self-pay

## 2023-08-15 NOTE — Telephone Encounter (Signed)
 Patient called with questions in regards to her medications. Instructed her to all medications as prescribed till she follows up with Dr. Shirlee Latch on 09/05/23, but if her blood  pressure drops below 90 systolic or she feels light headed or dizzy she needs to call the office.

## 2023-09-04 ENCOUNTER — Telehealth: Payer: Self-pay | Admitting: Cardiology

## 2023-09-04 NOTE — Telephone Encounter (Signed)
 Called to confirm/remind patient of their appointment at the [facility] on 09/05/23.   Appointment:   [] Confirmed  [x] Left mess   [] No answer/No voice mail  [] VM Full/unable to leave message  [] Phone not in service  Patient reminded to bring all medications and/or complete list.  Confirmed patient has transportation. Gave directions, instructed to utilize valet parking.

## 2023-09-05 ENCOUNTER — Ambulatory Visit: Attending: Cardiology | Admitting: Cardiology

## 2023-09-05 ENCOUNTER — Encounter: Payer: Self-pay | Admitting: Cardiology

## 2023-09-05 VITALS — Wt 145.2 lb

## 2023-09-05 DIAGNOSIS — Z7902 Long term (current) use of antithrombotics/antiplatelets: Secondary | ICD-10-CM | POA: Diagnosis not present

## 2023-09-05 DIAGNOSIS — I2510 Atherosclerotic heart disease of native coronary artery without angina pectoris: Secondary | ICD-10-CM | POA: Insufficient documentation

## 2023-09-05 DIAGNOSIS — R0609 Other forms of dyspnea: Secondary | ICD-10-CM | POA: Diagnosis present

## 2023-09-05 DIAGNOSIS — M069 Rheumatoid arthritis, unspecified: Secondary | ICD-10-CM | POA: Diagnosis not present

## 2023-09-05 DIAGNOSIS — I110 Hypertensive heart disease with heart failure: Secondary | ICD-10-CM | POA: Diagnosis not present

## 2023-09-05 DIAGNOSIS — I4510 Unspecified right bundle-branch block: Secondary | ICD-10-CM | POA: Diagnosis not present

## 2023-09-05 DIAGNOSIS — E785 Hyperlipidemia, unspecified: Secondary | ICD-10-CM | POA: Diagnosis not present

## 2023-09-05 DIAGNOSIS — I255 Ischemic cardiomyopathy: Secondary | ICD-10-CM | POA: Diagnosis present

## 2023-09-05 DIAGNOSIS — Z79899 Other long term (current) drug therapy: Secondary | ICD-10-CM | POA: Diagnosis not present

## 2023-09-05 DIAGNOSIS — I509 Heart failure, unspecified: Secondary | ICD-10-CM | POA: Diagnosis present

## 2023-09-05 DIAGNOSIS — E896 Postprocedural adrenocortical (-medullary) hypofunction: Secondary | ICD-10-CM | POA: Insufficient documentation

## 2023-09-05 DIAGNOSIS — I5022 Chronic systolic (congestive) heart failure: Secondary | ICD-10-CM | POA: Diagnosis not present

## 2023-09-05 LAB — BASIC METABOLIC PANEL WITH GFR
Anion gap: 11 (ref 5–15)
BUN: 18 mg/dL (ref 8–23)
CO2: 24 mmol/L (ref 22–32)
Calcium: 8.8 mg/dL — ABNORMAL LOW (ref 8.9–10.3)
Chloride: 102 mmol/L (ref 98–111)
Creatinine, Ser: 0.58 mg/dL (ref 0.44–1.00)
GFR, Estimated: >60 mL/min (ref >60)
Glucose, Bld: 101 mg/dL — ABNORMAL HIGH (ref 70–99)
Potassium: 3.9 mmol/L (ref 3.5–5.1)
Sodium: 137 mmol/L (ref 135–145)

## 2023-09-05 LAB — BRAIN NATRIURETIC PEPTIDE: B Natriuretic Peptide: 354.4 pg/mL — ABNORMAL HIGH (ref 0.0–100.0)

## 2023-09-05 MED ORDER — POTASSIUM CHLORIDE ER 10 MEQ PO TBCR: 10.0000 meq | EXTENDED_RELEASE_TABLET | Freq: Every day | ORAL | 3 refills | Status: AC | PRN

## 2023-09-05 MED ORDER — SACUBITRIL-VALSARTAN 24-26 MG PO TABS: ORAL_TABLET | ORAL | 6 refills | Status: DC

## 2023-09-05 NOTE — Progress Notes (Signed)
 PCP: Rosslyn Coons, MD HF Cardiology: Dr. Mitzie Anda  Chief Complaint: CHF  78 y.o. with history of DM2, rheumatoid arthritis, pheochromocytoma s/p right adrenalectomy was referred by Dr. Jesse Moritz for evaluation of CHF.  No prior cardiac history.  For several months prior to initial visit, she had noted exertional dyspnea, and for 2-3 months she had had lower leg swelling.  She is also limited chronically by joint pain from rheumatoid arthritis.   Echo was done in 12/24, showing EF 25-30%, moderate RV dysfunction, mild RV enlargement.   She had RHC/LHC in 1/25, showing normal filling pressures, mild pulmonary hypertension and preserved CO.  The proximally LAD was chronically totally occluded with minimal collaterals.   Patient was admitted in 2/25 with SBO from strangulated incisional hernia.  She had bowel resection and re-anastomosis.  Entresto  and spironolactone  were stopped due to low BP.  She was sent to SNF for rehab.   She returns today for followup of CHF and CAD. She is now home from SNF and is no longer using oxygen. She has her baseline knee arthritis which is significantly limiting. Weight down 6 lbs.  She is walking with a walker and getting PT.  She is using Lasix  only prn.  No chest pain.  No dyspnea walking around her house.  No orthopnea/PND.  Aaron Aas   ECG (personally reviewed): NSR, old ASMI, LAFB, RBBB  Labs (5/24): K 4, creatinine 0.7, LDL 77 Labs (10/24): TSH normal, hgb 14.5 Labs (1/25): K 3.7, creatinine 0.54 Labs (2/25): K 3.5, creatinine 0.46, Lp(a) 16, LDL 77 Labs (3/25): K 3.9, creatinine 0.47, LDL 41, TGs 219, BNP 574  PMH: 1. Type 2 diabetes 2. Hyperlipidemia 3. 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  5. Endometrial cancer: 2010.  6. Chronic systolic CHF: Echo (12/24) with EF 25-30%, moderate RV dysfunction, mild RV enlargement.  - LHC/RHC (1/25): Mean RA 6, PA 44/16, mean PCWP 8, CI 2.96; occluded proximal  LAD (chronic).  7. CAD: Cath in 1/25 with chronically occluded proximal LAD.  8. SBO: 2/25, due to strangulated incisional hernia, s/p bowel resection and reanastomosis.   FH: Brother with PCI  SH: Originally from [place], lives in [place]. Widow.  No smoking or ETOH. 2 children.   ROS: All systems reviewed and negative except as per HPI.   Current Outpatient Medications  Medication Sig Dispense Refill   Abatacept  (ORENCIA ) 125 MG/ML SOSY Inject 125 mg into the skin every 30 (thirty) days.     acetaminophen  (TYLENOL ) 650 MG CR tablet Take 1,300 mg by mouth every morning.     atorvastatin  (LIPITOR) 80 MG tablet Take 1 tablet (80 mg total) by mouth daily. 90 tablet 3   clopidogrel  (PLAVIX ) 75 MG tablet Take 1 tablet (75 mg total) by mouth daily. 30 tablet 11   CONTOUR NEXT TEST test strip USE TO SELF MONITOR BLOOD GLUCOSE DAILY DX E11.9     cycloSPORINE (RESTASIS) 0.05 % ophthalmic emulsion Place 1 drop into both eyes 2 (two) times daily.      diphenhydramine -acetaminophen  (TYLENOL  PM) 25-500 MG TABS tablet Take 1-2 tablets by mouth at bedtime as needed.     ergocalciferol (VITAMIN D2) 50000 units capsule Take 50,000 Units by mouth once a week.     FARXIGA  10 MG TABS tablet Take 1 tablet (10 mg total) by mouth daily before breakfast. 90 tablet 3   furosemide  (LASIX ) 20 MG tablet Take 1 tablet (20 mg total) by mouth daily. (Patient taking differently: Take  20 mg by mouth daily as needed for edema or fluid.) 90 tablet 3   leflunomide (ARAVA) 10 MG tablet Take 10 mg by mouth daily.     melatonin 5 MG TABS Take 1 tablet (5 mg total) by mouth at bedtime.     metFORMIN (GLUCOPHAGE-XR) 500 MG 24 hr tablet Take 500 mg by mouth 2 (two) times daily.     metoprolol  succinate (TOPROL  XL) 25 MG 24 hr tablet Take 0.5 tablets (12.5 mg total) by mouth at bedtime. 45 tablet 3   Multiple Vitamins-Minerals (MULTIVITAMIN WITH MINERALS) tablet Take 1 tablet by mouth daily.     ondansetron  (ZOFRAN ) 4 MG tablet  Take 1 tablet (4 mg total) by mouth every 6 (six) hours as needed for nausea.     sacubitril -valsartan  (ENTRESTO ) 24-26 MG Take 1/2 tab two times daily 60 tablet 6   spironolactone  (ALDACTONE ) 25 MG tablet Take 0.5 tablets (12.5 mg total) by mouth at bedtime. 45 tablet 3   potassium chloride  (KLOR-CON ) 10 MEQ tablet Take 1 tablet (10 mEq total) by mouth daily as needed. 90 tablet 3   No current facility-administered medications for this visit.   Wt 145 lb 3.2 oz (65.9 kg)   BMI 28.36 kg/m  General: NAD Neck: No JVD, no thyromegaly or thyroid  nodule.  Lungs: Clear to auscultation bilaterally with normal respiratory effort. CV: Nondisplaced PMI.  Heart regular S1/S2, no S3/S4, no murmur.  No peripheral edema.  No carotid bruit.  Normal pedal pulses.  Abdomen: Soft, nontender, no hepatosplenomegaly, no distention.  Skin: Intact without lesions or rashes.  Neurologic: Alert and oriented x 3.  Psych: Normal affect. Extremities: No clubbing or cyanosis.  HEENT: Normal.   Assessment/Plan: 1. Chronic systolic CHF: Ischemic cardiomyopathy.  Echo in 12/24 showed EF 25-30%, moderate RV dysfunction, mild RV enlargement.  LHC/RHC in 1/25 showed occluded proximal LAD that is likely the culprit for her cardiomyopathy.  RHC showed normal filling pressure, preserved cardiac output, mild pulmonary arterial hypertension. NYHA class II-III, arthritis and poor balance contribute to exertional limitation.  She is not volume overloaded on exam.   - She can continue Lasix  as needed, change KCl to as needed as well.   - Continue Farxiga  10 mg daily.  - Restart Entresto  at 24/26 1/2 tab bid.  Increase to full tab if BP tolerates.  - Continue Toprol  XL 12.5 daily.  - Continue spironolactone  12.5 at bedtime.  - I will arrange for repeat echo in a month or so.  If EF remains < 35%, will need to consider ICD.  Narrow QRS, not CRT candidate.  2. HTN: BP controlled.   3. H/o right adrenal pheochromocytoma: S/p right  adrenalectomy.  4. Hyperlipidemia: Continue atorvastatin , good LDL in 3/25.  5. Rheumatoid arthritis: This limits her ambulation due to joint pain.  6. CAD: LHC in 1/25 with chronic occlusion of the proximal LAD without significant collaterals.  No target for PCI or CABG.  She does not have a history of a severe chest pain event. Needs aggressive secondary prevention.  - Continue Plavix .  - Continue atorvastatin   Followup in 6 wks  I spent 31 minutes reviewing records, interviewing/examining patient, and managing orders.   Peder Bourdon 09/05/2023

## 2023-09-05 NOTE — Patient Instructions (Signed)
 Medication Changes:  START Entresto  24-26mg  (1/2 tab) two times daily  CHANGE Take Potassium only when taking as needed Furosemide   Lab Work:  Go over to the [facility]. Go pass the gift shop and have your blood work completed. TODAY AND AGAIN IN TWO WEEKS  We will only call you if the results are abnormal or if the provider would like to make medication changes.   Testing/Procedures:  Your physician has requested that you have an echocardiogram. Echocardiography is a painless test that uses sound waves to create images of your heart. It provides your doctor with information about the size and shape of your heart and how well your heart's chambers and valves are working. This procedure takes approximately one hour. There are no restrictions for this procedure. Please do NOT wear cologne, perfume, aftershave, or lotions (deodorant is allowed). Please arrive 15 minutes prior to your appointment time.  Please note: We ask at that you not bring children with you during ultrasound (echo/ vascular) testing. Due to room size and safety concerns, children are not allowed in the ultrasound rooms during exams. Our front office staff cannot provide observation of children in our lobby area while testing is being conducted. An adult accompanying a patient to their appointment will only be allowed in the ultrasound room at the discretion of the ultrasound technician under special circumstances. We apologize for any inconvenience.  Please have your echo completed. You will check in for this at the [facility]. You have to arrive 15 MINS EARLY for preparation, otherwise you will have to reschedule.   Follow-Up in: Please follow up with the [facility] in May with Dr. Mitzie Anda.  At the [facility], you and your health needs are our priority. We have a designated team specialized in the treatment of Heart Failure. This Care Team includes your primary Heart Failure  Specialized Cardiologist (physician), Advanced Practice Providers (APPs- Physician Assistants and Nurse Practitioners), and Pharmacist who all work together to provide you with the care you need, when you need it.   You may see any of the following providers on your designated Care Team at your next follow up:  Dr. Jules Oar Dr. Peder Bourdon Dr. Alwin Baars Dr. Judyth Nunnery Shawnee Dellen, FNP Bevely Brush, RPH-CPP  Please be sure to bring in all your medications bottles to every appointment.   Need to Contact Us :  If you have any questions or concerns before your next appointment please send us  a message through [place] or call our office at [phone].    TO LEAVE A MESSAGE FOR THE NURSE SELECT OPTION 2, PLEASE LEAVE A MESSAGE INCLUDING: YOUR NAME DATE OF BIRTH CALL BACK NUMBER REASON FOR CALL**this is important as we prioritize the call backs  YOU WILL RECEIVE A CALL BACK THE SAME DAY AS LONG AS YOU CALL BEFORE 4:00 PM

## 2023-09-10 ENCOUNTER — Telehealth: Payer: Self-pay

## 2023-09-10 NOTE — Telephone Encounter (Signed)
 Called pt to make them aware of scheduled echo. Lvm to return call.

## 2023-09-13 NOTE — Telephone Encounter (Signed)
 Spoke to pt about scheduled echo. Pt verbalized understanding and agreement to plan.   No precert required

## 2023-10-09 ENCOUNTER — Telehealth: Payer: Self-pay | Admitting: Cardiology

## 2023-10-09 NOTE — Telephone Encounter (Signed)
 Called to confirm/remind patient of their appointment at the [facility] on 10/10/23.   Appointment:   [x] Confirmed  [] Left mess   [] No answer/No voice mail  [] VM Full/unable to leave message  [] Phone not in service  Patient reminded to bring all medications and/or complete list.  Confirmed patient has transportation. Gave directions, instructed to utilize valet parking.

## 2023-10-10 ENCOUNTER — Encounter: Admitting: Cardiology

## 2023-10-26 ENCOUNTER — Ambulatory Visit

## 2023-11-29 ENCOUNTER — Telehealth: Payer: Self-pay | Admitting: Family

## 2023-11-29 NOTE — Telephone Encounter (Signed)
 Pt called wanting appointment for swelling in her legs and feet. Denies shortness of breath. States that she has started prednisone for a rash and it has caused swelling. Pt agreeable to next available appt.

## 2023-11-30 ENCOUNTER — Telehealth: Payer: Self-pay | Admitting: Cardiology

## 2023-11-30 NOTE — Telephone Encounter (Signed)
 Called to confirm/remind patient of their appointment at the [facility] on 12/03/23.   Appointment:   [x] Confirmed  [] Left mess   [] No answer/No voice mail  [] VM Full/unable to leave message  [] Phone not in service  Patient reminded to bring all medications and/or complete list.  Confirmed patient has transportation. Gave directions, instructed to utilize valet parking.

## 2023-12-03 ENCOUNTER — Encounter: Payer: Self-pay | Admitting: Cardiology

## 2023-12-03 ENCOUNTER — Ambulatory Visit: Attending: Cardiology | Admitting: Cardiology

## 2023-12-03 DIAGNOSIS — I5022 Chronic systolic (congestive) heart failure: Secondary | ICD-10-CM

## 2023-12-03 DIAGNOSIS — I509 Heart failure, unspecified: Secondary | ICD-10-CM | POA: Diagnosis present

## 2023-12-03 MED ORDER — METOPROLOL SUCCINATE ER 25 MG PO TB24: 12.5000 mg | ORAL_TABLET | Freq: Every evening | ORAL | 3 refills | Status: AC

## 2023-12-03 MED ORDER — SPIRONOLACTONE 25 MG PO TABS: 12.5000 mg | ORAL_TABLET | Freq: Every evening | ORAL | 3 refills | Status: DC

## 2023-12-03 NOTE — Progress Notes (Signed)
   ADVANCED [facility] FAIL[facility] FOLLOW UP CLINIC NOTE  Referring Physician: Nichole Senior, MD  Primary Care: Nichole Senior, MD Primary Cardiologist:  HPI: Tammy Ashley is a 78 y.o. female who presents for follow up of chronic systolic heart failure.      Echo was done in 12/24, showing EF 25-30%, moderate RV dysfunction, mild RV enlargement.    She had RHC/LHC in 1/25, showing normal filling pressures, mild pulmonary hypertension and preserved CO.  The proximally LAD was chronically totally occluded with minimal collaterals.    Patient was admitted in 2/25 with SBO from strangulated incisional hernia.  She had bowel resection and re-anastomosis.  Entresto  and spironolactone  were stopped due to low BP.  She was sent to SNF for rehab.      SUBJECTIVE:  Patient showed up to an acute visit for concerns about lower extremity swelling. She has noticed worsening swelling since she was placed on steroids for her RA and a new rash that she had noticed. There is concern that she has psoriatic arthritis and she was she has been placed on methotrexate and steroids.  She was concerned about a potential interaction between methotrexate and Lasix , which we discussed is okay to take together.  Her steroid dose has been significantly reduced, and she only has trace lower extremity edema and no other symptoms of volume overload at this time.  PMH, current medications, allergies, social history, and family history reviewed in epic.  PHYSICAL EXAM: Vitals:   12/03/23 1407  BP: 106/60  Pulse: (!) 107  SpO2: 94%   GENERAL: Well nourished and in no apparent distress at rest.  PULM:  Normal work of breathing, clear to auscultation bilaterally. Respirations are unlabored.  CARDIAC:  JVP: Flat         Tachycardic rate with regular rhythm. No murmurs, rubs or gallops.  Trace lower extremity edema. Warm and well perfused extremities. ABDOMEN: Soft, non-tender, non-distended. NEUROLOGIC: Patient is  oriented x3 with no focal or lateralizing neurologic deficits.    ASSESSMENT & PLAN:  Chronic systolic CHF: Ischemic cardiomyopathy.  Echo in 12/24 showed EF 25-30%, moderate RV dysfunction, mild RV enlargement.  LHC/RHC in 1/25 showed occluded proximal LAD that is likely the culprit for her cardiomyopathy.  RHC showed normal filling pressure, preserved cardiac output, mild pulmonary arterial hypertension.  Comes in today with concern for volume overload, trace, largely nonpitting lower extremity edema.  Discussed that she is safe to take Lasix  and methotrexate, but do not believe that she is significantly volume overloaded, would recommend Lasix  for the next 1 to 2 days then as needed. - Continue Lasix  as needed - Continue Farxiga  10 mg daily - Continue Entresto  24/26 1/2 tablets twice daily - Continue metoprolol  XL 12.5 mg daily, spironolactone  12.5 mg daily at bedtime  2. HTN: BP controlled on regimen above.    3. H/o right adrenal pheochromocytoma: S/p right adrenalectomy.   4. Hyperlipidemia: Continue atorvastatin , good LDL in 3/25.   5. ?  Psoriatic/rheumatoid arthritis: This limits her ambulation due to joint pain.  - Currently on methotrexate and a low-dose of steroids, considering biologic therapy with Orencia   6. CAD: LHC in 1/25 with chronic occlusion of the proximal LAD without significant collaterals.  No target for PCI or CABG.  Asymptomatic, continue secondary prevention. - Continue Plavix .  - Continue atorvastatin    Morene Brownie, MD Advanced [facility] Mechanical Circulatory Support 12/03/23

## 2023-12-31 ENCOUNTER — Ambulatory Visit
Admission: RE | Admit: 2023-12-31 | Discharge: 2023-12-31 | Disposition: A | Discharge status: Home / Self Care | Source: Ambulatory Visit | Attending: Cardiology | Admitting: Cardiology

## 2023-12-31 ENCOUNTER — Ambulatory Visit (HOSPITAL_COMMUNITY): Payer: Self-pay | Admitting: Cardiology

## 2023-12-31 DIAGNOSIS — I5022 Chronic systolic (congestive) heart failure: Secondary | ICD-10-CM

## 2023-12-31 DIAGNOSIS — I340 Nonrheumatic mitral (valve) insufficiency: Secondary | ICD-10-CM | POA: Insufficient documentation

## 2023-12-31 DIAGNOSIS — I110 Hypertensive heart disease with heart failure: Secondary | ICD-10-CM | POA: Insufficient documentation

## 2023-12-31 LAB — ECHOCARDIOGRAM COMPLETE
AR max vel: 2.52 cm2
AV Area VTI: 2.56 cm2
AV Area mean vel: 2.61 cm2
AV Mean grad: 2.5 mmHg
AV Peak grad: 5.2 mmHg
Ao pk vel: 1.15 m/s
Area-P 1/2: 7.22 cm2
Calc EF: 34.4 %
MV VTI: 1.54 cm2
S' Lateral: 3.6 cm
Single Plane A2C EF: 16.8 %
Single Plane A4C EF: 47 %

## 2023-12-31 NOTE — Progress Notes (Signed)
*  PRELIMINARY RESULTS* Echocardiogram 2D Echocardiogram has been performed.  Floydene Harder 12/31/2023, 11:45 AM

## 2024-01-01 NOTE — Telephone Encounter (Signed)
 Spoke to pt about echocardiogram results. Pt agreeable to ep referral for aicd. Offered pt sooner appt to discuss results with dr. Mclean. Pt states that she will wait until the sept appt.

## 2024-01-21 ENCOUNTER — Telehealth: Payer: Self-pay | Admitting: Cardiology

## 2024-01-21 ENCOUNTER — Other Ambulatory Visit: Payer: Self-pay

## 2024-01-21 MED ORDER — FARXIGA 10 MG PO TABS: 10.0000 mg | ORAL_TABLET | Freq: Every day | ORAL | 3 refills | Status: AC

## 2024-01-25 ENCOUNTER — Telehealth: Payer: Self-pay | Admitting: Cardiology

## 2024-01-25 NOTE — Telephone Encounter (Signed)
 Called to confirm/remind patient of their appointment at the [facility] on 01/28/24.   Appointment:   [] Confirmed  [x] Left mess   [] No answer/No voice mail  [] VM Full/unable to leave message  [] Phone not in service  Patient reminded to bring all medications and/or complete list.  Confirmed patient has transportation. Gave directions, instructed to utilize valet parking.

## 2024-01-28 ENCOUNTER — Ambulatory Visit (HOSPITAL_BASED_OUTPATIENT_CLINIC_OR_DEPARTMENT_OTHER): Admitting: Cardiology

## 2024-01-28 ENCOUNTER — Ambulatory Visit (HOSPITAL_COMMUNITY): Payer: Self-pay | Admitting: Cardiology

## 2024-01-28 ENCOUNTER — Other Ambulatory Visit (HOSPITAL_COMMUNITY): Payer: Self-pay

## 2024-01-28 ENCOUNTER — Other Ambulatory Visit
Admission: RE | Admit: 2024-01-28 | Discharge: 2024-01-28 | Disposition: A | Discharge status: Home / Self Care | Source: Ambulatory Visit | Attending: Cardiology | Admitting: Cardiology

## 2024-01-28 VITALS — BP 112/50 | HR 97 | Wt 157.0 lb

## 2024-01-28 DIAGNOSIS — I5022 Chronic systolic (congestive) heart failure: Secondary | ICD-10-CM | POA: Insufficient documentation

## 2024-01-28 LAB — BASIC METABOLIC PANEL WITH GFR
Anion gap: 15 (ref 5–15)
BUN: 18 mg/dL (ref 8–23)
CO2: 29 mmol/L (ref 22–32)
Calcium: 9.3 mg/dL (ref 8.9–10.3)
Chloride: 100 mmol/L (ref 98–111)
Creatinine, Ser: 0.48 mg/dL (ref 0.44–1.00)
GFR, Estimated: >60 mL/min (ref >60)
Glucose, Bld: 113 mg/dL — ABNORMAL HIGH (ref 70–99)
Potassium: 3.7 mmol/L (ref 3.5–5.1)
Sodium: 144 mmol/L (ref 135–145)

## 2024-01-28 LAB — BRAIN NATRIURETIC PEPTIDE: B Natriuretic Peptide: 222.7 pg/mL — ABNORMAL HIGH (ref 0.0–100.0)

## 2024-01-28 LAB — CK: Total CK: 32 U/L — ABNORMAL LOW (ref 38–234)

## 2024-01-28 MED ORDER — FUROSEMIDE 20 MG PO TABS: 40.0000 mg | ORAL_TABLET | Freq: Every day | ORAL | 11 refills | Status: DC

## 2024-01-28 MED ORDER — REPATHA SURECLICK 140 MG/ML ~~LOC~~ SOAJ: 140.0000 mg | SUBCUTANEOUS | 5 refills | Status: AC

## 2024-01-28 MED ORDER — SPIRONOLACTONE 25 MG PO TABS: 25.0000 mg | ORAL_TABLET | Freq: Every day | ORAL | 3 refills | Status: DC

## 2024-01-28 NOTE — Progress Notes (Signed)
 ReDS Vest / Clip - 01/28/24 1500       ReDS Vest / Clip   Station Marker A    Ruler Value 28    ReDS Value Range Low volume    ReDS Actual Value 35

## 2024-01-28 NOTE — Patient Instructions (Addendum)
 Medication Changes:  INCREASE LASIX  TO 40 MG ONCE DAILY  START REPATHA  140 MG ONCE EVERY 2 WEEKS  START SPIRONOLACTONE  25 MG ONCE DAILY  DISCONTINUE ATORVASTATIN    Lab Work:  Go over to the [facility]. Go pass the gift shop and have your blood work completed TODAY AND AGAIN IN 10 DAYS AFTER   STARTING NEW MEDICATIONS.  We will only call you if the results are abnormal or if the provider would like to make medication changes.  No news is good news.   Special Instructions // Education:  PLEASE WEAR COMPRESSION STOCKINGS DAILY AND REMOVE THEM AT BEDTIME.   Follow-Up in: IN 10 DAYS WITH OUR PHARMACIST NASON AND IN 1 MONTH WITH DR. ROLAN.   Thank you for choosing [facility].    At the [facility], you and your health needs are our priority. We have a designated team specialized in the treatment of Heart Failure. This Care Team includes your primary Heart Failure Specialized Cardiologist (physician), Advanced Practice Providers (APPs- Physician Assistants and Nurse Practitioners), and Pharmacist who all work together to provide you with the care you need, when you need it.   You may see any of the following providers on your designated Care Team at your next follow up:  Dr. Toribio Fuel Dr. Ezra ROLAN Dr. Ria Commander Dr. Morene Brownie Ellouise Class, FNP Jaun Bash, RPH-CPP  Please be sure to bring in all your medications bottles to every appointment.   Need to Contact Us :  If you have any questions or concerns before your next appointment please send us  a message through [place] or call our office at [phone].    TO LEAVE A MESSAGE FOR THE NURSE SELECT OPTION 2, PLEASE LEAVE A MESSAGE INCLUDING: YOUR NAME DATE OF BIRTH CALL BACK NUMBER REASON FOR CALL**this is important as we prioritize the call backs  YOU WILL RECEIVE A CALL BACK THE SAME DAY AS LONG AS YOU CALL BEFORE 4:00 PM

## 2024-01-28 NOTE — Progress Notes (Signed)
 PCP: Nichole Senior, MD HF Cardiology: Dr. Rolan  Chief Complaint: CHF  78 y.o. with history of DM2, rheumatoid arthritis, pheochromocytoma s/p right adrenalectomy was referred by Dr. Nichole for evaluation of CHF.  No prior cardiac history.  For several months prior to initial visit, she had noted exertional dyspnea, and for 2-3 months she had had lower leg swelling.  She is also limited chronically by joint pain from rheumatoid arthritis.   Echo was done in 12/24, showing EF 25-30%, moderate RV dysfunction, mild RV enlargement.   She had RHC/LHC in 1/25, showing normal filling pressures, mild pulmonary hypertension and preserved CO.  The proximally LAD was chronically totally occluded with minimal collaterals.   Patient was admitted in 2/25 with SBO from strangulated incisional hernia.  She had bowel resection and re-anastomosis.  Entresto  and spironolactone  were stopped due to low BP.  She was sent to SNF for rehab.   Echo in 8/25 showed EF 30-35%, mild LVH, LAD territory WMAs, normal RV, normal IVC.   She returns today for followup of CHF and CAD. Weight is up 6 lbs.  She has significant peripheral edema.  This started to develop after she was on prednisone for RA, now off. Her main complaint has been muscle pain especially in the thighs and back.  This does not feel like her typical RA-related pain.  There was concern that her statin could have been the culprit as I had increased it a few months ago.  Atorvastatin  was decreased to 20 mg daily and Zetia was begun.  She is still having the muscle pain.  This is very debilitating and limiting her activity. Her spironolactone  was also stopped, I am not sure why.  She started taking Lasix  20 mg daily again about 1 week ago.  This has not changed her edema much.  No dyspnea walking around the house but not very active.  No chest pain.  No orthopnea/PND.  She will get lightheaded only if she stands up too fast.   ECG (personally reviewed): NSR, old  ASMI, LAFB, RBBB  REDS clip 35%  Labs (5/24): K 4, creatinine 0.7, LDL 77 Labs (10/24): TSH normal, hgb 14.5 Labs (1/25): K 3.7, creatinine 0.54 Labs (2/25): K 3.5, creatinine 0.46, Lp(a) 16, LDL 77 Labs (3/25): K 3.9, creatinine 0.47, LDL 41, TGs 219, BNP 574 Labs (4/25): K 3.9, creatinine 0.58, BNP 354  PMH: 1. Type 2 diabetes 2. Hyperlipidemia - Myalgias with atorvastatin .  3. 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  5. Endometrial cancer: 2010.  6. Chronic systolic CHF: Echo (12/24) with EF 25-30%, moderate RV dysfunction, mild RV enlargement.  - LHC/RHC (1/25): Mean RA 6, PA 44/16, mean PCWP 8, CI 2.96; occluded proximal LAD (chronic).  - Echo (8/25): EF 30-35%, mild LVH, LAD territory WMAs, normal RV, normal IVC.  7. CAD: Cath in 1/25 with chronically occluded proximal LAD.  8. SBO: 2/25, due to strangulated incisional hernia, s/p bowel resection and reanastomosis.   FH: Brother with PCI  SH: Originally from [place], lives in [place]. Widow.  No smoking or ETOH. 2 children.   ROS: All systems reviewed and negative except as per HPI.   Current Outpatient Medications  Medication Sig Dispense Refill   Evolocumab  (REPATHA  SURECLICK) 140 MG/ML SOAJ Inject 140 mg into the skin every 14 (fourteen) days. 2 mL 5   furosemide  (LASIX ) 20 MG tablet Take 2 tablets (40 mg total) by mouth daily. 60 tablet 11   spironolactone  (  ALDACTONE ) 25 MG tablet Take 1 tablet (25 mg total) by mouth daily. 90 tablet 3   Abatacept  (ORENCIA ) 125 MG/ML SOSY Inject 125 mg into the skin every 30 (thirty) days. (Patient not taking: Reported on 12/03/2023)     acetaminophen  (TYLENOL ) 650 MG CR tablet Take 1,300 mg by mouth every morning.     atorvastatin  (LIPITOR) 80 MG tablet Take 1 tablet (80 mg total) by mouth daily. 90 tablet 3   clopidogrel  (PLAVIX ) 75 MG tablet Take 1 tablet (75 mg total) by mouth daily. 30 tablet 11   CONTOUR NEXT TEST test  strip USE TO SELF MONITOR BLOOD GLUCOSE DAILY DX E11.9     cycloSPORINE (RESTASIS) 0.05 % ophthalmic emulsion Place 1 drop into both eyes 2 (two) times daily.      diphenhydramine -acetaminophen  (TYLENOL  PM) 25-500 MG TABS tablet Take 1-2 tablets by mouth at bedtime as needed.     ergocalciferol (VITAMIN D2) 50000 units capsule Take 50,000 Units by mouth once a week.     FARXIGA  10 MG TABS tablet Take 1 tablet (10 mg total) by mouth daily before breakfast. 90 tablet 3   leflunomide (ARAVA) 10 MG tablet Take 10 mg by mouth daily. (Patient not taking: Reported on 12/03/2023)     melatonin 5 MG TABS Take 1 tablet (5 mg total) by mouth at bedtime.     metFORMIN (GLUCOPHAGE-XR) 500 MG 24 hr tablet Take 500 mg by mouth 2 (two) times daily.     metoprolol  succinate (TOPROL  XL) 25 MG 24 hr tablet Take 0.5 tablets (12.5 mg total) by mouth at bedtime. 45 tablet 3   Multiple Vitamins-Minerals (MULTIVITAMIN WITH MINERALS) tablet Take 1 tablet by mouth daily.     ondansetron  (ZOFRAN ) 4 MG tablet Take 1 tablet (4 mg total) by mouth every 6 (six) hours as needed for nausea. (Patient not taking: Reported on 12/03/2023)     potassium chloride  (KLOR-CON ) 10 MEQ tablet Take 1 tablet (10 mEq total) by mouth daily as needed. 90 tablet 3   sacubitril -valsartan  (ENTRESTO ) 24-26 MG Take 1/2 tab two times daily 60 tablet 6   No current facility-administered medications for this visit.   BP (!) 112/50   Pulse 97   Wt 157 lb (71.2 kg)   SpO2 94%   BMI 30.66 kg/m  General: NAD Neck: JVP 10 cm, no thyromegaly or thyroid  nodule.  Lungs: Clear to auscultation bilaterally with normal respiratory effort. CV: Nondisplaced PMI.  Heart regular S1/S2, no S3/S4, no murmur.  2+ edema to knees.  No carotid bruit.  Normal pedal pulses.  Abdomen: Soft, nontender, no hepatosplenomegaly, no distention.  Skin: Intact without lesions or rashes.  Neurologic: Alert and oriented x 3.  Psych: Normal affect. Extremities: No clubbing or  cyanosis.  HEENT: Normal.   Assessment/Plan: 1. Chronic systolic CHF: Ischemic cardiomyopathy.  Echo in 12/24 showed EF 25-30%, moderate RV dysfunction, mild RV enlargement.  LHC/RHC in 1/25 showed occluded proximal LAD that is likely the culprit for her cardiomyopathy.  RHC showed normal filling pressure, preserved cardiac output, mild pulmonary arterial hypertension. Echo in 8/25 showed EF 30-35%, mild LVH, LAD territory WMAs, normal RV, normal IVC. NYHA class III symptoms but confounded by muscle pain and inactivity.  She is volume overloaded on exam, weight is up 6 lbs though REDS clip only borderline elevated.  She had been on prednisone and off Lasix  when she developed the peripheral edema, now Lasix  has been restarted and she is off prednisone.   -  Increase Lasix  to 40 mg daily.  - Restart spironolactone  at 25 mg daily.  - Continue Farxiga  10 mg daily.  - Continue Entresto  24/26 1/2 tab bid.  Increase to full tab at followup with HF pharmacist if BP tolerates changes made today.  - Continue Toprol  XL 12.5 daily.  - EF remains < 35% with ischemic cardiomyopathy, will need to consider ICD.  Narrow QRS, not CRT candidate. She is not certain yet that she wants ICD but is willing to discuss with EP.  She has an appointment in October.  2. HTN: BP controlled.   3. H/o right adrenal pheochromocytoma: S/p right adrenalectomy.  4. Hyperlipidemia: Severe myalgias, may be statin related.  Atorvastatin  has been cut back but still taking 20 mg daily.  - Stop atorvastatin  and check CK level.  - Start Repatha  to replace statin, check lipids in 2 months.  - Continue Zetia 10 mg daily.   5. Rheumatoid arthritis: This limits her ambulation due to joint pain. If stopping atorvastatin  fails to resolve her pain, she will need to be seen by her rheumatologist for further evaluation.  6. CAD: LHC in 1/25 with chronic occlusion of the proximal LAD without significant collaterals.  No target for PCI or CABG.  She  does not have a history of a severe chest pain event. Needs aggressive secondary prevention. No recent chest pain.  - Continue Plavix .  - Starting Repatha .   Followup in 10 days with HF pharmacist for med titration, see me in 1 month.   I spent 41 minutes reviewing records, interviewing/examining patient, and managing orders.   Ezra Shuck 01/28/2024

## 2024-02-04 ENCOUNTER — Telehealth: Payer: Self-pay | Admitting: Pharmacist

## 2024-02-04 NOTE — Telephone Encounter (Signed)
 Patient called to state she has not started Repatha .

## 2024-02-07 ENCOUNTER — Ambulatory Visit (HOSPITAL_COMMUNITY): Payer: Self-pay | Admitting: Cardiology

## 2024-02-07 ENCOUNTER — Other Ambulatory Visit
Admission: RE | Admit: 2024-02-07 | Discharge: 2024-02-07 | Disposition: A | Discharge status: Home / Self Care | Source: Ambulatory Visit | Attending: Cardiology | Admitting: Cardiology

## 2024-02-07 DIAGNOSIS — I5022 Chronic systolic (congestive) heart failure: Secondary | ICD-10-CM | POA: Diagnosis present

## 2024-02-07 LAB — BASIC METABOLIC PANEL WITH GFR
Anion gap: 12 (ref 5–15)
BUN: 18 mg/dL (ref 8–23)
CO2: 24 mmol/L (ref 22–32)
Calcium: 9 mg/dL (ref 8.9–10.3)
Chloride: 102 mmol/L (ref 98–111)
Creatinine, Ser: 0.5 mg/dL (ref 0.44–1.00)
GFR, Estimated: >60 mL/min (ref >60)
Glucose, Bld: 125 mg/dL — ABNORMAL HIGH (ref 70–99)
Potassium: 4 mmol/L (ref 3.5–5.1)
Sodium: 138 mmol/L (ref 135–145)

## 2024-02-11 ENCOUNTER — Encounter: Admitting: Cardiology

## 2024-02-12 ENCOUNTER — Other Ambulatory Visit

## 2024-02-13 ENCOUNTER — Ambulatory Visit: Admitting: Cardiology

## 2024-02-13 ENCOUNTER — Telehealth: Payer: Self-pay | Admitting: Family

## 2024-02-13 NOTE — Telephone Encounter (Signed)
 Called to confirm/remind patient of their appointment at the [facility] on 02/14/24.   Appointment:   [x] Confirmed  [] Left mess   [] No answer/No voice mail  [] VM Full/unable to leave message  [] Phone not in service  Patient reminded to bring all medications and/or complete list.  Confirmed patient has transportation. Gave directions, instructed to utilize valet parking.

## 2024-02-14 ENCOUNTER — Other Ambulatory Visit (HOSPITAL_COMMUNITY): Payer: Self-pay

## 2024-02-14 ENCOUNTER — Ambulatory Visit: Attending: Cardiology | Admitting: Pharmacist

## 2024-02-14 VITALS — BP 92/65 | HR 97 | Wt 156.0 lb

## 2024-02-14 DIAGNOSIS — M069 Rheumatoid arthritis, unspecified: Secondary | ICD-10-CM | POA: Insufficient documentation

## 2024-02-14 DIAGNOSIS — R5382 Chronic fatigue, unspecified: Secondary | ICD-10-CM | POA: Diagnosis present

## 2024-02-14 DIAGNOSIS — Z7902 Long term (current) use of antithrombotics/antiplatelets: Secondary | ICD-10-CM | POA: Insufficient documentation

## 2024-02-14 DIAGNOSIS — E119 Type 2 diabetes mellitus without complications: Secondary | ICD-10-CM | POA: Diagnosis not present

## 2024-02-14 DIAGNOSIS — I5022 Chronic systolic (congestive) heart failure: Secondary | ICD-10-CM

## 2024-02-14 DIAGNOSIS — Z7984 Long term (current) use of oral hypoglycemic drugs: Secondary | ICD-10-CM | POA: Insufficient documentation

## 2024-02-14 DIAGNOSIS — E785 Hyperlipidemia, unspecified: Secondary | ICD-10-CM | POA: Diagnosis not present

## 2024-02-14 DIAGNOSIS — R42 Dizziness and giddiness: Secondary | ICD-10-CM | POA: Diagnosis present

## 2024-02-14 DIAGNOSIS — I2510 Atherosclerotic heart disease of native coronary artery without angina pectoris: Secondary | ICD-10-CM | POA: Insufficient documentation

## 2024-02-14 DIAGNOSIS — R29898 Other symptoms and signs involving the musculoskeletal system: Secondary | ICD-10-CM | POA: Insufficient documentation

## 2024-02-14 DIAGNOSIS — I110 Hypertensive heart disease with heart failure: Secondary | ICD-10-CM | POA: Diagnosis not present

## 2024-02-14 DIAGNOSIS — Z79899 Other long term (current) drug therapy: Secondary | ICD-10-CM | POA: Diagnosis not present

## 2024-02-14 DIAGNOSIS — I509 Heart failure, unspecified: Secondary | ICD-10-CM | POA: Diagnosis present

## 2024-02-14 DIAGNOSIS — E896 Postprocedural adrenocortical (-medullary) hypofunction: Secondary | ICD-10-CM | POA: Insufficient documentation

## 2024-02-14 DIAGNOSIS — I255 Ischemic cardiomyopathy: Secondary | ICD-10-CM | POA: Diagnosis not present

## 2024-02-14 MED ORDER — SACUBITRIL-VALSARTAN 24-26 MG PO TABS: ORAL_TABLET | ORAL | 6 refills | Status: AC

## 2024-02-14 MED ORDER — SPIRONOLACTONE 25 MG PO TABS: 12.5000 mg | ORAL_TABLET | Freq: Every day | ORAL | 2 refills | Status: AC

## 2024-02-14 MED ORDER — FUROSEMIDE 20 MG PO TABS: ORAL_TABLET | ORAL | 2 refills | Status: AC

## 2024-02-14 NOTE — Patient Instructions (Addendum)
 It was a pleasure seeing you today!  MEDICATIONS: -Take an additional tablet of furosemide  today. In the future, take an additional tablet as needed for weight gain and symptoms of swelling or shortness of breath. -Continue spironolactone  12.5 mg  (one half tablet) daily -Continue Entresto  24-26 mg one half tablet twice daily -Call if you have questions about your medications.  LABS: -We will call you if your labs need attention.  NEXT APPOINTMENT: Return to clinic in 1 and half weeks to see Dr. Rolan.  In general, to take care of your heart failure: -Limit your fluid intake to 2 Liters (half-gallon) per day.   -Limit your salt intake to ideally 2-3 grams (2000-3000 mg) per day. -Weigh yourself daily and record, and bring that weight diary to your next appointment.  (Weight gain of 2-3 pounds in 1 day typically means fluid weight.) -The medications for your heart are to help your heart and help you live longer.   -Please contact us  before stopping any of your heart medications.  Call the clinic at [phone] with questions or to reschedule future appointments.

## 2024-02-14 NOTE — Progress Notes (Signed)
 [facility] Note  PCP: Nichole Senior, MD PCP-Cardiologist: None HF-Cardiologist: Ezra Shuck, MD  HPI:  78 y.o. with history of DM2, rheumatoid arthritis, pheochromocytoma s/p right adrenalectomy was referred by Dr. Nichole for evaluation of CHF given several month history of exertional dyspnea,  and lower extremity swelling.  She was started on Lasix   by Dr. Nichole and reported losing about 10 lbs over a few weeks.  She is also limited chronically by joint pain from rheumatoid arthritis. Echo was done in 04/2023, showing EF 25-30%, moderate RV dysfunction, mild RV enlargement.   Seen by Dr. Shuck on 05/22/23 where Tammy Ashley reported limitations in ADLs due to CHF and RA, requiring a wheelchair for most activities. At that visit, lisinopril was transitioned to Entresto  24-26 mg BID and Farxiga  10 mg daily was added.  Catheterization 06/07/23 showed proximal LAD to mid LAD lesion 100% stenosed (CTO) with minimal collaterals and mid RCA lesion 20% stenosed. Intervention was not performed due to probable lack of benefit. Filling pressures and cardiac output/index were normal. PA pressure was elevated 44/19 (31) with PVR of 4.6.  Patient was admitted in 06/2023 with SBO from strangulated incisional hernia.  She had bowel resection and re-anastomosis.  Entresto  and spironolactone  were stopped due to low BP.  She was sent to SNF for rehab.    Echo in 8/25 showed EF 30-35%, mild LVH, LAD territory WMAs, normal RV, normal IVC.   Seen by Dr. Shuck last on 01/28/24. She had been dealing with muscle aches suspected from statin therapy and hypervolemia unresolved after restarting furosemide  20 mg daily. Furosemide  was increased to 40 mg daily, spironolactone  25 mg daily was restarted, and Repatha  replaced atorvastatin .   Today Tammy Ashley returns to [facility] for pharmacist medication titration. Reports not feeling well due to chronic fatigue, LEE, and poor sleep.  Reports dizziness 2-3 times per day. Denies chest pain, palpitations, SOB, orthopnea, PND. Reports being able to complete activities of daily living (ADLs). Is somewhat active throughout the day. Ambulates with walker due to leg pain and weakness. Weight at home is ~151-152 pounds. Takes furosemide  20 mg daily, stating that she did not tolerate 40 mg daily due to feeling awful.. Reports ~2L per day oral fluid intake, but does have a higher sodium diet with high amounts of fruit and carbs with low protein. Appetite is fair. Reports systolic BP at home is generally 90-100 mmHg with some orthostasis. HR at home is generally ~90 bpm. Reports BP and HR increase in medical offices.  Current Heart Failure Medications: Loop diuretic: furosemide  20 mg daily (self-reduced) Beta-Blocker: metoprolol  succinate 12.5 mg daily ACEI/ARB/ARNI: Entresto  24-26 mg 1/2 tablet twice daily MRA: spironolactone  12.5 mg daily SGLT2i: Farxiga  10 mg daily  Has the patient been experiencing any side effects to the medications prescribed? Yes. Reports muscle pain has persisted now 2 weeks after stopping atorvastatin . Also hypotensive occasionally at home with some orthostasis 2-3 times daily.  Does the patient have any problems obtaining medications due to transportation or finances? No. Patient has the [employer] grant.  Understanding of regimen: Good  Understanding of indications: Good  Potential of adherence: Fair. Reports self-decreasing furosemide  and spironolactone  after increase last visit.  Patient understands to avoid NSAIDs. Patient understands to avoid decongestants.  Pertinent Lab Values: Creatinine  Date Value Ref Range Status  04/11/2013 0.56 (L) 0.60 - 1.30 mg/dL Final   Creatinine, Ser  Date Value Ref Range Status  02/07/2024 0.50 0.44 - 1.00 mg/dL  Final   BUN  Date Value Ref Range Status  02/07/2024 18 8 - 23 mg/dL Final  97/94/7974 14 8 - 27 mg/dL Final  88/71/7985 5 (L) 7 - 18 mg/dL Final    Potassium  Date Value Ref Range Status  02/07/2024 4.0 3.5 - 5.1 mmol/L Final  04/11/2013 3.5 3.5 - 5.1 mmol/L Final   Sodium  Date Value Ref Range Status  02/07/2024 138 135 - 145 mmol/L Final  06/20/2023 142 134 - 144 mmol/L Final  04/11/2013 138 136 - 145 mmol/L Final   B Natriuretic Peptide  Date Value Ref Range Status  01/28/2024 222.7 (H) 0.0 - 100.0 pg/mL Final    Comment:    Performed at [facility], [address] [place], [place] [address]   Magnesium   Date Value Ref Range Status  07/04/2023 2.0 1.7 - 2.4 mg/dL Final    Comment:    Performed at [facility], [address] [place], [place] [address]   Vital Signs: Today's Vitals   02/14/24 1315  BP: 92/65  Pulse: 97  SpO2: 92%  Weight: 156 lb (70.8 kg)    Assessment/Plan: 1. Chronic systolic CHF: Ischemic cardiomyopathy.  Echo in 04/2023 showed EF 25-30%, moderate RV dysfunction, mild RV enlargement.  LHC/RHC in 05/2023 showed occluded proximal LAD that is likely the culprit for her cardiomyopathy.  RHC showed normal filling pressure, preserved cardiac output, mild pulmonary arterial hypertension. Echo in 12/2023 showed EF 30-35%, mild LVH, LAD territory WMAs, normal RV, normal IVC. NYHA class III symptoms but confounded by muscle pain and inactivity.  Weight down 1 lb. Has mild-moderate LEE on exam, but does not otherwise appear volume overloaded. - Patient attempted to increase to furosemide  40 mg daily x several days, but self-reduced back to 20 mg daily due to poor tolerance. After lengthy discussion, patient is willing to take additional furosemide  tablet today and as needed for weight gain. Patient will acquire compression stockings and modify diet in efforts to decrease lower extremity fluid retention. - Patient attempted to increase spironolactone  to 25 mg daily as instructed, but decreased back to a half tablet daily due to poor tolerance. If patient continues to have intolerance, could  consider Kerendia given history of DM (copay $0 for 90 days). - Continue Farxiga  10 mg daily.  - Continue Entresto  24/26 1/2 tab bid. Unable to increase today due to BP and orthostasis. May eventually require losartan if she continues to have tolerance issues. - Continue Toprol  XL 12.5 daily.  - Repeat BMET prior to next MD visit - EF remains < 35% with ischemic cardiomyopathy, will need to consider ICD.  Narrow QRS, not CRT candidate. She is not certain yet that she wants ICD but is willing to discuss with EP.  She has an appointment in October.  2. HTN: BP controlled.   3. H/o right adrenal pheochromocytoma: S/p right adrenalectomy.  4. Hyperlipidemia: Severe myalgias, may be statin related.  Atorvastatin  has been cut back but still taking 20 mg daily.   - Continue Repatha  to replace statin, check lipids in 2 months.  - Patient stopped taking Zetia 10 mg daily. Given new Repatha  and previously controlled LDL, can continue off at this time. 5. Rheumatoid arthritis: This limits her ambulation due to joint pain. If stopping atorvastatin  fails to resolve her pain, she will need to be seen by her rheumatologist for further evaluation.  6. CAD: LHC in 05/2023 with chronic occlusion of the proximal LAD without significant collaterals.  No target for PCI or CABG.  She does not have a history of a severe chest pain event. Needs aggressive secondary prevention. No recent chest pain.  - Continue Plavix .  - Continue Repatha .   Follow up:  Dr. Rolan in 2 weeks  Please do not hesitate to reach out with questions or concerns,  Jaun Bash, PharmD, CPP, BCPS Heart Failure Pharmacist  Phone - [phone] 02/14/2024 1:58 PM

## 2024-02-22 ENCOUNTER — Telehealth: Payer: Self-pay | Admitting: Cardiology

## 2024-02-22 NOTE — Telephone Encounter (Signed)
 Called to confirm/remind patient of their appointment at the [facility] on 02/25/24.   Appointment:   [x] Confirmed  [] Left mess   [] No answer/No voice mail  [] VM Full/unable to leave message  [] Phone not in service  Patient reminded to bring all medications and/or complete list.  Confirmed patient has transportation. Gave directions, instructed to utilize valet parking.

## 2024-02-25 ENCOUNTER — Ambulatory Visit (HOSPITAL_COMMUNITY): Payer: Self-pay | Admitting: Cardiology

## 2024-02-25 ENCOUNTER — Other Ambulatory Visit
Admission: RE | Admit: 2024-02-25 | Discharge: 2024-02-25 | Disposition: A | Discharge status: Home / Self Care | Attending: Cardiology | Admitting: Cardiology

## 2024-02-25 ENCOUNTER — Ambulatory Visit: Admitting: Physician Assistant

## 2024-02-25 VITALS — BP 111/67 | HR 89 | Wt 158.0 lb

## 2024-02-25 DIAGNOSIS — E782 Mixed hyperlipidemia: Secondary | ICD-10-CM

## 2024-02-25 DIAGNOSIS — I255 Ischemic cardiomyopathy: Secondary | ICD-10-CM

## 2024-02-25 DIAGNOSIS — I2510 Atherosclerotic heart disease of native coronary artery without angina pectoris: Secondary | ICD-10-CM | POA: Diagnosis not present

## 2024-02-25 DIAGNOSIS — I5022 Chronic systolic (congestive) heart failure: Secondary | ICD-10-CM

## 2024-02-25 LAB — BASIC METABOLIC PANEL WITH GFR
Anion gap: 12 (ref 5–15)
BUN: 22 mg/dL (ref 8–23)
CO2: 26 mmol/L (ref 22–32)
Calcium: 9.2 mg/dL (ref 8.9–10.3)
Chloride: 102 mmol/L (ref 98–111)
Creatinine, Ser: 0.54 mg/dL (ref 0.44–1.00)
GFR, Estimated: >60 mL/min (ref >60)
Glucose, Bld: 123 mg/dL — ABNORMAL HIGH (ref 70–99)
Potassium: 3.9 mmol/L (ref 3.5–5.1)
Sodium: 140 mmol/L (ref 135–145)

## 2024-02-25 LAB — BRAIN NATRIURETIC PEPTIDE: B Natriuretic Peptide: 191 pg/mL — ABNORMAL HIGH (ref 0.0–100.0)

## 2024-02-25 NOTE — Patient Instructions (Signed)
 Medication Changes:  TAKE EXTRA 20mg   of Furosemide  FOR 2 DAYS ONLY   Follow-Up in: Please follow up with the [facility] in 8 weeks with Dr. Rolan.    Thank you for choosing [facility].    At the [facility], you and your health needs are our priority. We have a designated team specialized in the treatment of Heart Failure. This Care Team includes your primary Heart Failure Specialized Cardiologist (physician), Advanced Practice Providers (APPs- Physician Assistants and Nurse Practitioners), and Pharmacist who all work together to provide you with the care you need, when you need it.   You may see any of the following providers on your designated Care Team at your next follow up:  Dr. Toribio Fuel Dr. Ezra Rolan Dr. Ria Commander Dr. Morene Brownie Ellouise Class, FNP Jaun Bash, RPH-CPP  Please be sure to bring in all your medications bottles to every appointment.   Need to Contact Us :  If you have any questions or concerns before your next appointment please send us  a message through [facility] or call our office at [phone].    TO LEAVE A MESSAGE FOR THE NURSE SELECT OPTION 2, PLEASE LEAVE A MESSAGE INCLUDING: YOUR NAME DATE OF BIRTH CALL BACK NUMBER REASON FOR CALL**this is important as we prioritize the call backs  YOU WILL RECEIVE A CALL BACK THE SAME DAY AS LONG AS YOU CALL BEFORE 4:00 PM

## 2024-02-25 NOTE — Progress Notes (Signed)
 PCP: Nichole Senior, MD HF Cardiology: Dr. Rolan  Chief Complaint: CHF  78 y.o. with history of DM2, rheumatoid arthritis, pheochromocytoma s/p right adrenalectomy was referred by Dr. Nichole for evaluation of CHF.  No prior cardiac history.  For several months prior to initial visit, she had noted exertional dyspnea, and for 2-3 months she had had lower leg swelling.  She is also limited chronically by joint pain from rheumatoid arthritis.   Echo was done in 12/24, showing EF 25-30%, moderate RV dysfunction, mild RV enlargement.   She had RHC/LHC in 1/25, showing normal filling pressures, mild pulmonary hypertension and preserved CO.  The proximally LAD was chronically totally occluded with minimal collaterals.   Patient was admitted in 2/25 with SBO from strangulated incisional hernia.  She had bowel resection and re-anastomosis.  Entresto  and spironolactone  were stopped due to low BP.  She was sent to SNF for rehab.   Echo in 8/25 showed EF 30-35%, mild LVH, LAD territory WMAs, normal RV, normal IVC.   Here today for f/u regarding CHF and CAD. Accompanied by her daughter who assists with history. Gets fatigued easily with activity. Ambulates with a walker. Legs ache and she tires out walking to the car. Notes some shortness of breath but primarily limited by chronic pain. No orthopnea or PND. Has some lower extremity edema. Denies chest pain. ome weight up 3 lb over last week, 153.8 lb on home scale today. Did not increase her diuretics after last visit with Dr. Rolan as they made her void frequently. Struggles with urinary incontinence. Notes some lightheadedness if changes positions too quickly. Home BP often 100s-110s systolic, but can dip to 80s-90s.   PMH: 1. Type 2 diabetes 2. Hyperlipidemia - Myalgias with atorvastatin .  3. 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  5. Endometrial cancer: 2010.  6. Chronic systolic CHF: Echo  (12/24) with EF 25-30%, moderate RV dysfunction, mild RV enlargement.  - LHC/RHC (1/25): Mean RA 6, PA 44/16, mean PCWP 8, CI 2.96; occluded proximal LAD (chronic).  - Echo (8/25): EF 30-35%, mild LVH, LAD territory WMAs, normal RV, normal IVC.  7. CAD: Cath in 1/25 with chronically occluded proximal LAD.  8. SBO: 2/25, due to strangulated incisional hernia, s/p bowel resection and reanastomosis.   FH: Brother with PCI  SH: Originally from [place], lives in [place]. Widow.  No smoking or ETOH. 2 children.   ROS: All systems reviewed and negative except as per HPI.   Current Outpatient Medications  Medication Sig Dispense Refill   Abatacept  (ORENCIA ) 125 MG/ML SOSY Inject 125 mg into the skin every 30 (thirty) days. (Patient not taking: Reported on 12/03/2023)     acetaminophen  (TYLENOL ) 650 MG CR tablet Take 1,300 mg by mouth every morning.     atorvastatin  (LIPITOR) 80 MG tablet Take 1 tablet (80 mg total) by mouth daily. 90 tablet 3   clopidogrel  (PLAVIX ) 75 MG tablet Take 1 tablet (75 mg total) by mouth daily. 30 tablet 11   CONTOUR NEXT TEST test strip USE TO SELF MONITOR BLOOD GLUCOSE DAILY DX E11.9     cycloSPORINE (RESTASIS) 0.05 % ophthalmic emulsion Place 1 drop into both eyes 2 (two) times daily.  (Patient not taking: Reported on 02/14/2024)     diphenhydramine -acetaminophen  (TYLENOL  PM) 25-500 MG TABS tablet Take 1-2 tablets by mouth at bedtime as needed. (Patient not taking: Reported on 02/14/2024)     ergocalciferol (VITAMIN D2) 50000 units capsule Take 50,000 Units by mouth  once a week.     Evolocumab  (REPATHA  SURECLICK) 140 MG/ML SOAJ Inject 140 mg into the skin every 14 (fourteen) days. 2 mL 5   FARXIGA  10 MG TABS tablet Take 1 tablet (10 mg total) by mouth daily before breakfast. 90 tablet 3   furosemide  (LASIX ) 20 MG tablet Take one tablet by mouth daily with an additional tablet as needed 30 tablet 2   leflunomide (ARAVA) 10 MG tablet Take 10 mg by mouth daily.      melatonin 5 MG TABS Take 1 tablet (5 mg total) by mouth at bedtime.     metFORMIN (GLUCOPHAGE-XR) 500 MG 24 hr tablet Take 500 mg by mouth 2 (two) times daily.     metoprolol  succinate (TOPROL  XL) 25 MG 24 hr tablet Take 0.5 tablets (12.5 mg total) by mouth at bedtime. 45 tablet 3   Multiple Vitamins-Minerals (MULTIVITAMIN WITH MINERALS) tablet Take 1 tablet by mouth daily.     ondansetron  (ZOFRAN ) 4 MG tablet Take 1 tablet (4 mg total) by mouth every 6 (six) hours as needed for nausea.     potassium chloride  (KLOR-CON ) 10 MEQ tablet Take 1 tablet (10 mEq total) by mouth daily as needed. 90 tablet 3   sacubitril -valsartan  (ENTRESTO ) 24-26 MG Take 1/2 tablet by mouth two times daily 60 tablet 6   spironolactone  (ALDACTONE ) 25 MG tablet Take 0.5 tablets (12.5 mg total) by mouth daily. 15 tablet 2   No current facility-administered medications for this visit.   BP 111/67   Pulse 89   Wt 158 lb (71.7 kg)   SpO2 95%   BMI 30.86 kg/m  General:  Elderly female. Arrived with walker Cor: JVP 7-8 cm. Regular rate & rhythm. No murmurs. Lungs: clear Abdomen: soft, nontender, nondistended.  Extremities: 2+ edema Neuro: alert & orientedx3. Affect pleasant   Assessment/Plan: 1. Chronic systolic CHF: Ischemic cardiomyopathy.  Echo in 12/24 showed EF 25-30%, moderate RV dysfunction, mild RV enlargement.  LHC/RHC in 1/25 showed occluded proximal LAD that is likely the culprit for her cardiomyopathy.  RHC showed normal filling pressure, preserved cardiac output, mild pulmonary arterial hypertension. Echo in 8/25 showed EF 30-35%, mild LVH, LAD territory WMAs, normal RV, normal IVC.  - NYHA III, confounded by muscle/joint pains and physical deconditioning - Volume mildly up on exam. Lasix  increased from 20 mg to 40 mg daily last visit but did not tolerate (made her void too frequently. Continue 20 mg lasix  daily, suggested she try taking 40 mg a couple of days a week. Wear compression stockings. - continue  spiro 12.5 mg daily (unable to tolerate higher dose d/t orthostasis) - Continue Farxiga  10 mg daily.  - Continue Entresto  24/26 1/2 tab bid. Likely wont be able to tolerate higher dose. Continues to struggle with orthostasis. - Continue Toprol  XL 12.5 daily.  - BP intermittently soft, 80s-90s. Continue to monitor - EF remains < 35% with ischemic cardiomyopathy, has appointment with EP next month to consider ICD. She is not sure she wants to pursue this.  Narrow QRS, not CRT candidate.  - BMET today with stable Scr 0.54, K 3.9, BNP 191  2. HTN: BP controlled.  On low end at times.  - Meds as above.  3. H/o right adrenal pheochromocytoma: S/p right adrenalectomy.  4. Hyperlipidemia: Severe myalgias. She states pain did not improve with stopping atorvastatin . - Now on repatha . Lipid panel next visit 5. Rheumatoid arthritis: This limits her ambulation due to joint pain.  - Rheumatologist recently added cosentyx  6. CAD: LHC in 1/25 with chronic occlusion of the proximal LAD without significant collaterals.  No target for PCI or CABG.  She does not have a history of a severe chest pain event. Needs aggressive secondary prevention. No recent angina. - Continue Plavix .  - Continue repatha   Followup 2 months with APP    [facility], Arlisha Patalano N 02/25/2024

## 2024-03-26 ENCOUNTER — Institutional Professional Consult (permissible substitution): Admitting: Cardiology

## 2024-04-17 ENCOUNTER — Telehealth: Payer: Self-pay

## 2024-04-17 NOTE — Telephone Encounter (Signed)
 Pt called concerned because she was told by her pharmacist to consult her cards team about interactions with Plavix  and new order of Fluconazole.

## 2024-04-18 NOTE — Telephone Encounter (Signed)
 Per Duwaine Plant, RPH pt ok to take both Plavix  and Fluconazole at the prescribed dose. Reviewed with pt. Pt agreeable. No further questions at this time.

## 2024-04-22 ENCOUNTER — Telehealth: Payer: Self-pay | Admitting: Family

## 2024-04-22 NOTE — Telephone Encounter (Signed)
 Called to confirm/remind patient of their appointment at the [facility] on 04/23/24.   Appointment:   [x] Confirmed  [] Left mess   [] No answer/No voice mail  [] VM Full/unable to leave message  [] Phone not in service  Patient reminded to bring all medications and/or complete list.  Confirmed patient has transportation. Gave directions, instructed to utilize valet parking.

## 2024-04-22 NOTE — Progress Notes (Unsigned)
 [facility] Note    PCP: Nichole Senior, MD HF Cardiology: Dr. Rolan  Chief Complaint: CHF  78 y.o. with history of DM2, rheumatoid arthritis, pheochromocytoma s/p right adrenalectomy was referred by Dr. Nichole for evaluation of CHF.  No prior cardiac history.  For several months prior to initial visit, she had noted exertional dyspnea, and for 2-3 months she had had lower leg swelling.  She is also limited chronically by joint pain from rheumatoid arthritis.   Echo was done in 12/24, showing EF 25-30%, moderate RV dysfunction, mild RV enlargement.   She had RHC/LHC in 1/25, showing normal filling pressures, mild pulmonary hypertension and preserved CO.  The proximally LAD was chronically totally occluded with minimal collaterals.   Patient was admitted in 2/25 with SBO from strangulated incisional hernia.  She had bowel resection and re-anastomosis.  Entresto  and spironolactone  were stopped due to low BP.  She was sent to SNF for rehab.   Echo in 8/25 showed EF 30-35%, mild LVH, LAD territory WMAs, normal RV, normal IVC.   Here today for f/u regarding CHF and CAD. Accompanied by her daughter who assists with history. Gets fatigued easily with activity. Ambulates with a walker. Legs ache and she tires out walking to the car. Notes some shortness of breath but primarily limited by chronic pain. No orthopnea or PND. Has some lower extremity edema. Denies chest pain. ome weight up 3 lb over last week, 153.8 lb on home scale today. Did not increase her diuretics after last visit with Dr. Rolan as they made her void frequently. Struggles with urinary incontinence. Notes some lightheadedness if changes positions too quickly. Home BP often 100s-110s systolic, but can dip to 80s-90s.   PMH: 1. Type 2 diabetes 2. Hyperlipidemia - Myalgias with atorvastatin .  3. 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  5. Endometrial  cancer: 2010.  6. Chronic systolic CHF: Echo (12/24) with EF 25-30%, moderate RV dysfunction, mild RV enlargement.  - LHC/RHC (1/25): Mean RA 6, PA 44/16, mean PCWP 8, CI 2.96; occluded proximal LAD (chronic).  - Echo (8/25): EF 30-35%, mild LVH, LAD territory WMAs, normal RV, normal IVC.  7. CAD: Cath in 1/25 with chronically occluded proximal LAD.  8. SBO: 2/25, due to strangulated incisional hernia, s/p bowel resection and reanastomosis.   FH: Brother with PCI  SH: Originally from [place], lives in [place]. Widow.  No smoking or ETOH. 2 children.   ROS: All systems reviewed and negative except as per HPI.   Current Outpatient Medications  Medication Sig Dispense Refill   Abatacept  (ORENCIA ) 125 MG/ML SOSY Inject 125 mg into the skin every 30 (thirty) days. (Patient not taking: Reported on 12/03/2023)     acetaminophen  (TYLENOL ) 650 MG CR tablet Take 1,300 mg by mouth every morning.     atorvastatin  (LIPITOR) 80 MG tablet Take 1 tablet (80 mg total) by mouth daily. 90 tablet 3   clopidogrel  (PLAVIX ) 75 MG tablet Take 1 tablet (75 mg total) by mouth daily. 30 tablet 11   CONTOUR NEXT TEST test strip USE TO SELF MONITOR BLOOD GLUCOSE DAILY DX E11.9     cycloSPORINE (RESTASIS) 0.05 % ophthalmic emulsion Place 1 drop into both eyes 2 (two) times daily.  (Patient not taking: Reported on 02/14/2024)     diphenhydramine -acetaminophen  (TYLENOL  PM) 25-500 MG TABS tablet Take 1-2 tablets by mouth at bedtime as needed. (Patient not taking: Reported on 02/14/2024)     ergocalciferol (VITAMIN D2)  50000 units capsule Take 50,000 Units by mouth once a week.     Evolocumab  (REPATHA  SURECLICK) 140 MG/ML SOAJ Inject 140 mg into the skin every 14 (fourteen) days. 2 mL 5   FARXIGA  10 MG TABS tablet Take 1 tablet (10 mg total) by mouth daily before breakfast. 90 tablet 3   furosemide  (LASIX ) 20 MG tablet Take one tablet by mouth daily with an additional tablet as needed 30 tablet 2   leflunomide (ARAVA) 10 MG  tablet Take 10 mg by mouth daily.     melatonin 5 MG TABS Take 1 tablet (5 mg total) by mouth at bedtime.     metFORMIN (GLUCOPHAGE-XR) 500 MG 24 hr tablet Take 500 mg by mouth 2 (two) times daily.     metoprolol  succinate (TOPROL  XL) 25 MG 24 hr tablet Take 0.5 tablets (12.5 mg total) by mouth at bedtime. 45 tablet 3   Multiple Vitamins-Minerals (MULTIVITAMIN WITH MINERALS) tablet Take 1 tablet by mouth daily.     ondansetron  (ZOFRAN ) 4 MG tablet Take 1 tablet (4 mg total) by mouth every 6 (six) hours as needed for nausea.     potassium chloride  (KLOR-CON ) 10 MEQ tablet Take 1 tablet (10 mEq total) by mouth daily as needed. 90 tablet 3   sacubitril -valsartan  (ENTRESTO ) 24-26 MG Take 1/2 tablet by mouth two times daily 60 tablet 6   spironolactone  (ALDACTONE ) 25 MG tablet Take 0.5 tablets (12.5 mg total) by mouth daily. 15 tablet 2   No current facility-administered medications for this visit.   There were no vitals taken for this visit. General:  Elderly female. Arrived with walker Cor: JVP 7-8 cm. Regular rate & rhythm. No murmurs. Lungs: clear Abdomen: soft, nontender, nondistended.  Extremities: 2+ edema Neuro: alert & orientedx3. Affect pleasant   Assessment/Plan: 1. Chronic systolic CHF: Ischemic cardiomyopathy.  Echo in 12/24 showed EF 25-30%, moderate RV dysfunction, mild RV enlargement.  LHC/RHC in 1/25 showed occluded proximal LAD that is likely the culprit for her cardiomyopathy.  RHC showed normal filling pressure, preserved cardiac output, mild pulmonary arterial hypertension. Echo in 8/25 showed EF 30-35%, mild LVH, LAD territory WMAs, normal RV, normal IVC.  - NYHA III, confounded by muscle/joint pains and physical deconditioning - Volume mildly up on exam. Lasix  increased from 20 mg to 40 mg daily last visit but did not tolerate (made her void too frequently. Continue 20 mg lasix  daily, suggested she try taking 40 mg a couple of days a week. Wear compression stockings. -  continue spiro 12.5 mg daily (unable to tolerate higher dose d/t orthostasis) - Continue Farxiga  10 mg daily.  - Continue Entresto  24/26 1/2 tab bid. Likely wont be able to tolerate higher dose. Continues to struggle with orthostasis. - Continue Toprol  XL 12.5 daily.  - BP intermittently soft, 80s-90s. Continue to monitor - EF remains < 35% with ischemic cardiomyopathy, has appointment with EP next month to consider ICD. She is not sure she wants to pursue this.  Narrow QRS, not CRT candidate.  - BMET today with stable Scr 0.54, K 3.9, BNP 191  2. HTN: BP controlled.  On low end at times.  - Meds as above.  3. H/o right adrenal pheochromocytoma: S/p right adrenalectomy.  4. Hyperlipidemia: Severe myalgias. She states pain did not improve with stopping atorvastatin . - Now on repatha . Lipid panel next visit 5. Rheumatoid arthritis: This limits her ambulation due to joint pain.  - Rheumatologist recently added cosentyx 6. CAD: LHC in 1/25 with chronic  occlusion of the proximal LAD without significant collaterals.  No target for PCI or CABG.  She does not have a history of a severe chest pain event. Needs aggressive secondary prevention. No recent angina. - Continue Plavix .  - Continue repatha   Followup 2 months with APP    Ellouise DELENA Class 04/22/2024     Ellouise DELENA Class, FNP 04/22/24

## 2024-04-23 ENCOUNTER — Ambulatory Visit: Attending: Family | Admitting: Family

## 2024-04-23 ENCOUNTER — Encounter: Payer: Self-pay | Admitting: Family

## 2024-04-23 VITALS — BP 105/57 | HR 98 | Wt 160.6 lb

## 2024-04-23 DIAGNOSIS — I10 Essential (primary) hypertension: Secondary | ICD-10-CM | POA: Diagnosis not present

## 2024-04-23 DIAGNOSIS — I255 Ischemic cardiomyopathy: Secondary | ICD-10-CM | POA: Insufficient documentation

## 2024-04-23 DIAGNOSIS — I110 Hypertensive heart disease with heart failure: Secondary | ICD-10-CM | POA: Insufficient documentation

## 2024-04-23 DIAGNOSIS — E782 Mixed hyperlipidemia: Secondary | ICD-10-CM

## 2024-04-23 DIAGNOSIS — M069 Rheumatoid arthritis, unspecified: Secondary | ICD-10-CM | POA: Diagnosis not present

## 2024-04-23 DIAGNOSIS — Z7902 Long term (current) use of antithrombotics/antiplatelets: Secondary | ICD-10-CM | POA: Insufficient documentation

## 2024-04-23 DIAGNOSIS — Z7984 Long term (current) use of oral hypoglycemic drugs: Secondary | ICD-10-CM | POA: Insufficient documentation

## 2024-04-23 DIAGNOSIS — I2510 Atherosclerotic heart disease of native coronary artery without angina pectoris: Secondary | ICD-10-CM | POA: Insufficient documentation

## 2024-04-23 DIAGNOSIS — Z86018 Personal history of other benign neoplasm: Secondary | ICD-10-CM | POA: Insufficient documentation

## 2024-04-23 DIAGNOSIS — M791 Myalgia, unspecified site: Secondary | ICD-10-CM | POA: Insufficient documentation

## 2024-04-23 DIAGNOSIS — E785 Hyperlipidemia, unspecified: Secondary | ICD-10-CM | POA: Insufficient documentation

## 2024-04-23 DIAGNOSIS — Z9981 Dependence on supplemental oxygen: Secondary | ICD-10-CM | POA: Insufficient documentation

## 2024-04-23 DIAGNOSIS — M2550 Pain in unspecified joint: Secondary | ICD-10-CM | POA: Insufficient documentation

## 2024-04-23 DIAGNOSIS — Z9049 Acquired absence of other specified parts of digestive tract: Secondary | ICD-10-CM | POA: Insufficient documentation

## 2024-04-23 DIAGNOSIS — D3501 Benign neoplasm of right adrenal gland: Secondary | ICD-10-CM | POA: Diagnosis not present

## 2024-04-23 DIAGNOSIS — I5022 Chronic systolic (congestive) heart failure: Secondary | ICD-10-CM | POA: Insufficient documentation

## 2024-04-23 DIAGNOSIS — I2721 Secondary pulmonary arterial hypertension: Secondary | ICD-10-CM | POA: Insufficient documentation

## 2024-04-23 DIAGNOSIS — R21 Rash and other nonspecific skin eruption: Secondary | ICD-10-CM | POA: Insufficient documentation

## 2024-04-23 DIAGNOSIS — Z79899 Other long term (current) drug therapy: Secondary | ICD-10-CM | POA: Insufficient documentation

## 2024-04-23 LAB — LIPID PANEL
Chol/HDL Ratio: 1.9 ratio (ref 0.0–4.4)
Cholesterol, Total: 99 mg/dL — ABNORMAL LOW (ref 100–199)
HDL: 51 mg/dL (ref >39)
LDL Chol Calc (NIH): 21 mg/dL (ref 0–99)
Triglycerides: 164 mg/dL — ABNORMAL HIGH (ref 0–149)
VLDL Cholesterol Cal: 27 mg/dL (ref 5–40)

## 2024-04-23 LAB — BASIC METABOLIC PANEL WITH GFR
BUN/Creatinine Ratio: 21 (ref 12–28)
BUN: 16 mg/dL (ref 8–27)
CO2: 27 mmol/L (ref 20–29)
Calcium: 9.9 mg/dL (ref 8.7–10.3)
Chloride: 102 mmol/L (ref 96–106)
Creatinine, Ser: 0.78 mg/dL (ref 0.57–1.00)
Glucose: 109 mg/dL — ABNORMAL HIGH (ref 70–99)
Potassium: 4.6 mmol/L (ref 3.5–5.2)
Sodium: 141 mmol/L (ref 134–144)
eGFR: 78 mL/min/1.73 (ref >59)

## 2024-04-23 NOTE — Patient Instructions (Addendum)
 It was nice to meet you today!  If you receive a satisfaction survey regarding the Heart Failure Clinic, please take the time to fill it out. This way we can continue to provide excellent care and make any changes that need to be made.   Medication Changes:  No medication changes today!  Lab Work:   Go downstairs to [facility] on [address] to have your blood work completed.  We will only call you if the results are abnormal or if the provider would like to make medication changes.  No news is good news.    Follow-Up in: Please follow up with the [facility] in 3-4 months with Dr. Rolan. We do not currently have that schedule. Please give us  a call in February in order to schedule your appointment for March.    Thank you for choosing [facility].    At the [facility], you and your health needs are our priority. We have a designated team specialized in the treatment of Heart Failure. This Care Team includes your primary Heart Failure Specialized Cardiologist (physician), Advanced Practice Providers (APPs- Physician Assistants and Nurse Practitioners), and Pharmacist who all work together to provide you with the care you need, when you need it.   You may see any of the following providers on your designated Care Team at your next follow up:  Dr. Toribio Fuel Dr. Ezra Rolan Dr. Ria Commander Dr. Morene Brownie Ellouise Class, FNP Jaun Bash, RPH-CPP  Please be sure to bring in all your medications bottles to every appointment.   Need to Contact Us :  If you have any questions or concerns before your next appointment please send us  a message through [place] or call our office at [phone].    TO LEAVE A MESSAGE FOR THE NURSE SELECT OPTION 2, PLEASE LEAVE A MESSAGE INCLUDING: YOUR NAME DATE OF BIRTH CALL BACK NUMBER REASON FOR CALL**this is important as we prioritize the call backs  YOU  WILL RECEIVE A CALL BACK THE SAME DAY AS LONG AS YOU CALL BEFORE 4:00 PM

## 2024-04-24 ENCOUNTER — Ambulatory Visit: Payer: Self-pay | Admitting: Family

## 2024-05-21 ENCOUNTER — Ambulatory Visit

## 2024-05-27 ENCOUNTER — Encounter: Payer: Self-pay | Admitting: Cardiology

## 2024-05-27 ENCOUNTER — Ambulatory Visit: Attending: Cardiology | Admitting: Cardiology

## 2024-05-27 VITALS — BP 118/60 | HR 65 | Ht 61.0 in | Wt 164.0 lb

## 2024-05-27 DIAGNOSIS — I255 Ischemic cardiomyopathy: Secondary | ICD-10-CM | POA: Insufficient documentation

## 2024-05-27 DIAGNOSIS — I4510 Unspecified right bundle-branch block: Secondary | ICD-10-CM | POA: Insufficient documentation

## 2024-05-27 DIAGNOSIS — I2510 Atherosclerotic heart disease of native coronary artery without angina pectoris: Secondary | ICD-10-CM | POA: Insufficient documentation

## 2024-05-27 DIAGNOSIS — I5022 Chronic systolic (congestive) heart failure: Secondary | ICD-10-CM | POA: Insufficient documentation

## 2024-05-27 DIAGNOSIS — M069 Rheumatoid arthritis, unspecified: Secondary | ICD-10-CM | POA: Insufficient documentation

## 2024-05-27 NOTE — Patient Instructions (Addendum)
 Medication Instructions:  Your physician recommends that you continue on your current medications as directed. Please refer to the Current Medication list given to you today.  *If you need a refill on your cardiac medications before your next appointment, please call your pharmacy*  Testing/Procedures: ICD Implant Your physician has recommended that you have a defibrillator inserted. An implantable cardioverter defibrillator (ICD) is a small device that is placed in your chest or, in rare cases, your abdomen. This device uses electrical pulses or shocks to help control life-threatening, irregular heartbeats that could lead the heart to suddenly stop beating (sudden cardiac arrest). Leads are attached to the ICD that goes into your heart. This is done in the hospital and usually requires an overnight stay.   Follow-Up: At [facility], you and your health needs are our priority.  As part of our continuing mission to provide you with exceptional heart care, our providers are all part of one team.  This team includes your primary Cardiologist (physician) and Advanced Practice Providers or APPs (Physician Assistants and Nurse Practitioners) who all work together to provide you with the care you need, when you need it.  Your next appointment:   Please contact us  if you would like to schedule an ICD implant

## 2024-05-27 NOTE — Progress Notes (Signed)
 " Electrophysiology Office Note:   Date:  05/27/2024  ID:  Tammy Ashley, DOB [birth year], MRN [id number]  Primary Cardiologist: None Electrophysiologist: Fonda Kitty, MD      History of Present Illness:   Tammy Ashley is a 79 y.o. female with h/o DM2, rheumatoid arthritis, pheochromocytoma s/p right adrenalectomy who is being seen today for evaluation for ICD implant.  Patient presents today as referral from HF clinic to discuss ICD candidacy.  Patient reports feeling okay today.  She has some ongoing exertional intolerance, dyspnea and lower extremity edema which has been consistent but not worsening.  Despite this, she lives independently and still feels that she has good quality of life.  She has family nearby to assist with her.  She tries to be as active as she can be.  No new or acute complaints today.  Review of systems complete and found to be negative unless listed in HPI.   EP Information / Studies Reviewed:    EKG is not ordered today. EKG from 01/28/24 reviewed which showed SR with RBBB, QRS .      Echo 12/31/23:   1. Left ventricular ejection fraction, by estimation, is 30 to 35%. The  left ventricle has moderately decreased function. The left ventricle  demonstrates regional wall motion abnormalities (see scoring  diagram/findings for description). There is mild  left ventricular hypertrophy. Left ventricular diastolic parameters are  consistent with Grade II diastolic dysfunction (pseudonormalization).  There is severe hypokinesis of the left ventricular, mid-apical anterior  wall and apical segment. The average  left ventricular global longitudinal strain is -5.7 %. The global  longitudinal strain is abnormal.   2. Right ventricular systolic function is normal. The right ventricular  size is normal. Tricuspid regurgitation signal is inadequate for assessing  PA pressure.   3. Left atrial size was mildly dilated.   4. The mitral valve is normal in  structure. Mild mitral valve  regurgitation. No evidence of mitral stenosis.   5. The aortic valve is normal in structure. Aortic valve regurgitation is  not visualized. No aortic stenosis is present.   6. The inferior vena cava is normal in size with greater than 50%  respiratory variability, suggesting right atrial pressure of 3 mmHg.   RHC/LHC 06/07/23:   Prox LAD to Mid LAD lesion is 100% stenosed.   Mid RCA lesion is 20% stenosed.   1. Normal filling pressures.  2. Mild pulmonary arterial hypertension (?related to OSA).  3. Preserved cardiac output.  4. Chronic total occlusion of the proximal LAD after D1 and S1.  Minimal collaterals.     Physical Exam:   VS:  BP 118/60 (BP Location: Left Arm, Patient Position: Sitting, Cuff Size: Normal)   Pulse 65   Ht 5' 1 (1.549 m)   Wt 164 lb (74.4 kg)   SpO2 97%   BMI 30.99 kg/m    Wt Readings from Last 3 Encounters:  05/27/24 164 lb (74.4 kg)  04/23/24 160 lb 9.6 oz (72.8 kg)  02/25/24 158 lb (71.7 kg)     General: Well developed, in no acute distress.  Neck: No JVD.  Cardiac: Normal rate, regular rhythm.  Resp: Normal work of breathing.  Ext: 1+ edema.  Neuro: No gross focal deficits.  Psych: Normal affect.    ASSESSMENT AND PLAN:    # Chronic systolic heart failure: NYHA class II/III # Ischemic cardiomyopathy:  # RBBB: QRS - Patient meets criteria for ICD in the setting of persistently  low LVEF less than 35% despite greater than 90 days of guideline directed medical therapy.  Additionally, she has right bundle branch block with QRS greater than 150 ms.  She has NYHA class III symptoms.  For this reason, if she were to choose to pursue ICD therapies, then I think CRT would be reasonable.  Risk and benefits of ICD implantation were discussed in detail today with patient and her son, patient would like to think about her options and will let us  know if she chooses to proceed. - Continue GDMT regimen and close follow-up  with HF team.  # CAD: LHC in 1/25 with chronic occlusion of the proximal LAD without significant collaterals.  No target for PCI or CABG. treated with medical therapy. - Continue Plavix  75mg  daily.  - Continue Repatha .  # Rheumatoid arthritis:  - She recently had issues with Cosentyx which led to diffuse fungal skin infection.  Will need this to be resolved prior to any implantation of ICD, if she chooses to proceed.  Follow up with Dr. Kennyth if she chooses to pursue ICD implant.   Signed, Fonda Kennyth, MD  "
